# Patient Record
Sex: Female | Born: 1980 | Race: Black or African American | Hispanic: No | Marital: Single | Smoking: Never smoker
Health system: Southern US, Community
[De-identification: ages and names within clinical notes are randomized; demographics above are authoritative.]

## PROBLEM LIST (undated history)

## (undated) DIAGNOSIS — J189 Pneumonia, unspecified organism: Secondary | ICD-10-CM

## (undated) DIAGNOSIS — Z8041 Family history of malignant neoplasm of ovary: Secondary | ICD-10-CM

## (undated) DIAGNOSIS — A9239 West Nile virus infection with other complications: Secondary | ICD-10-CM

## (undated) DIAGNOSIS — Z800 Family history of malignant neoplasm of digestive organs: Secondary | ICD-10-CM

## (undated) DIAGNOSIS — Z803 Family history of malignant neoplasm of breast: Secondary | ICD-10-CM

## (undated) DIAGNOSIS — K219 Gastro-esophageal reflux disease without esophagitis: Secondary | ICD-10-CM

## (undated) DIAGNOSIS — Z1371 Encounter for nonprocreative screening for genetic disease carrier status: Secondary | ICD-10-CM

## (undated) DIAGNOSIS — R569 Unspecified convulsions: Secondary | ICD-10-CM

## (undated) DIAGNOSIS — Z8042 Family history of malignant neoplasm of prostate: Secondary | ICD-10-CM

## (undated) DIAGNOSIS — M1990 Unspecified osteoarthritis, unspecified site: Secondary | ICD-10-CM

## (undated) DIAGNOSIS — D219 Benign neoplasm of connective and other soft tissue, unspecified: Secondary | ICD-10-CM

## (undated) DIAGNOSIS — E669 Obesity, unspecified: Secondary | ICD-10-CM

## (undated) DIAGNOSIS — D649 Anemia, unspecified: Secondary | ICD-10-CM

## (undated) HISTORY — DX: Family history of malignant neoplasm of prostate: Z80.42

## (undated) HISTORY — DX: Family history of malignant neoplasm of breast: Z80.3

## (undated) HISTORY — PX: ABDOMINAL HYSTERECTOMY: SHX81

## (undated) HISTORY — DX: Family history of malignant neoplasm of ovary: Z80.41

## (undated) HISTORY — PX: MULTIPLE TOOTH EXTRACTIONS: SHX2053

## (undated) HISTORY — DX: Obesity, unspecified: E66.9

## (undated) HISTORY — PX: BRAIN SURGERY: SHX531

## (undated) HISTORY — PX: DILATION AND CURETTAGE OF UTERUS: SHX78

## (undated) HISTORY — DX: Family history of malignant neoplasm of digestive organs: Z80.0

---

## 2009-07-02 DIAGNOSIS — A9239 West Nile virus infection with other complications: Secondary | ICD-10-CM

## 2009-07-02 DIAGNOSIS — G038 Meningitis due to other specified causes: Secondary | ICD-10-CM

## 2009-07-02 HISTORY — PX: ESOPHAGEAL DILATION: SHX303

## 2009-07-02 HISTORY — DX: Meningitis due to other specified causes: G03.8

## 2009-07-02 HISTORY — PX: BRAIN SURGERY: SHX531

## 2009-07-02 HISTORY — DX: West Nile virus infection with other complications: A92.39

## 2009-12-12 ENCOUNTER — Encounter: Admission: RE | Admit: 2009-12-12 | Discharge: 2010-02-15 | Payer: Self-pay | Admitting: Rheumatology

## 2010-02-02 ENCOUNTER — Ambulatory Visit: Payer: Self-pay | Admitting: Psychology

## 2011-10-31 ENCOUNTER — Ambulatory Visit (HOSPITAL_COMMUNITY)
Admission: RE | Admit: 2011-10-31 | Discharge: 2011-10-31 | Disposition: A | Discharge status: Home / Self Care | Payer: Medicaid Other | Source: Ambulatory Visit | Attending: Pediatrics | Admitting: Pediatrics

## 2011-10-31 ENCOUNTER — Other Ambulatory Visit (HOSPITAL_COMMUNITY): Payer: Self-pay

## 2011-10-31 DIAGNOSIS — A9230 West Nile virus infection, unspecified: Secondary | ICD-10-CM | POA: Insufficient documentation

## 2011-10-31 NOTE — Procedures (Signed)
EEG NUMBER:  [id number].  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  She has not had seizures since hospitalization.  The purpose of this EEG is to determine if she can wean from anticonvulsants.  MEDICATIONS:  Keppra.  The EEG was done with the patient awake and drowsy.  During periods of maximal wakefulness, she had a well regulated, well-sustained 10 cycle per 2nd, low dominant amplitude, posterior dominant rhythm that attenuated with eye opening and it was symmetric.  Background activities were composed of low to very low amplitude beta activities that were symmetric.  Photic stimulation produced a symmetric driving response. Hyperventilation for 3 minutes with good effort did not markedly change the tracing.  The patient became drowsy as characterized by the onset of slow roving eye movements, the attenuation of muscle activity, and the attenuation of the alpha rhythm.  Slower background activities in the theta and delta range were seen that were symmetric.  Stage 2 sleep was not reached.  Clinical interpretation: This sleep-deprived EEG done with the patient awake and drowsy he is normal.          ______________________________ Denton Meek, MD    GN:FAOZ D:  10/31/2011 20:56:37  T:  10/31/2011 21:10:21  Job #:  [id number]

## 2012-01-14 ENCOUNTER — Other Ambulatory Visit: Payer: Self-pay | Admitting: Obstetrics & Gynecology

## 2012-01-14 ENCOUNTER — Other Ambulatory Visit (HOSPITAL_COMMUNITY): Payer: Self-pay | Admitting: *Deleted

## 2012-01-14 DIAGNOSIS — R51 Headache: Secondary | ICD-10-CM

## 2012-01-18 ENCOUNTER — Other Ambulatory Visit (HOSPITAL_COMMUNITY): Payer: Medicaid Other

## 2012-01-30 ENCOUNTER — Encounter (HOSPITAL_COMMUNITY): Payer: Self-pay | Admitting: Pharmacist

## 2012-02-01 ENCOUNTER — Other Ambulatory Visit: Payer: Self-pay | Admitting: Obstetrics and Gynecology

## 2012-02-07 ENCOUNTER — Encounter (HOSPITAL_COMMUNITY): Payer: Self-pay

## 2012-02-07 ENCOUNTER — Other Ambulatory Visit (HOSPITAL_COMMUNITY): Payer: Medicaid Other

## 2012-02-07 ENCOUNTER — Encounter (HOSPITAL_COMMUNITY)
Admission: RE | Admit: 2012-02-07 | Discharge: 2012-02-07 | Disposition: A | Discharge status: Home / Self Care | Payer: Medicaid Other | Source: Ambulatory Visit | Attending: Obstetrics and Gynecology | Admitting: Obstetrics and Gynecology

## 2012-02-07 HISTORY — DX: Anemia, unspecified: D64.9

## 2012-02-07 HISTORY — DX: Gastro-esophageal reflux disease without esophagitis: K21.9

## 2012-02-07 HISTORY — DX: Unspecified convulsions: R56.9

## 2012-02-07 LAB — CBC
Hemoglobin: 11.4 g/dL — ABNORMAL LOW (ref 12.0–15.0)
MCHC: 31.8 g/dL (ref 30.0–36.0)
Platelets: 399 10*3/uL (ref 150–400)

## 2012-02-07 LAB — BASIC METABOLIC PANEL
Chloride: 100 mEq/L (ref 96–112)
GFR calc Af Amer: >90 mL/min (ref >90)
Potassium: 3.6 mEq/L (ref 3.5–5.1)

## 2012-02-07 NOTE — Patient Instructions (Addendum)
20 MELLA INCLAN  02/07/2012   Your procedure is scheduled on:  02/12/12  Enter through the Main Entrance of [facility] at 1030 AM.  Pick up the phone at the desk and dial [phone].   Call this number if you have problems the morning of surgery: [phone]   Remember:   Do not eat food:After Midnight.  Do not drink clear liquids: After Midnight.  Take these medicines the morning of surgery with A SIP OF WATER: Keppra   Do not wear jewelry, make-up or nail polish.  Do not wear lotions, powders, or perfumes. You may wear deodorant.  Do not shave 48 hours prior to surgery.  Do not bring valuables to the hospital.  Contacts, dentures or bridgework may not be worn into surgery.  Leave suitcase in the car. After surgery it may be brought to your room.  For patients admitted to the hospital, checkout time is 11:00 AM the day of discharge.   Patients discharged the day of surgery will not be allowed to drive home.  Name and phone number of your driver: mother   Maryclare Bean  Special Instructions: CHG Shower Use Special Wash: 1/2 bottle night before surgery and 1/2 bottle morning of surgery.   Please read over the following fact sheets that you were given: Surgical Site Infection Prevention

## 2012-02-07 NOTE — Pre-Procedure Instructions (Signed)
Patient history reviewed with Dr [name], no need for anes. Pre-op.

## 2012-02-12 ENCOUNTER — Encounter (HOSPITAL_COMMUNITY)
Admission: RE | Disposition: A | Discharge status: Home / Self Care | Payer: Self-pay | Source: Ambulatory Visit | Attending: Obstetrics and Gynecology

## 2012-02-12 ENCOUNTER — Encounter (HOSPITAL_COMMUNITY): Payer: Self-pay | Admitting: Anesthesiology

## 2012-02-12 ENCOUNTER — Encounter (HOSPITAL_COMMUNITY): Payer: Self-pay | Admitting: *Deleted

## 2012-02-12 ENCOUNTER — Ambulatory Visit (HOSPITAL_COMMUNITY)
Admission: RE | Admit: 2012-02-12 | Discharge: 2012-02-12 | Disposition: A | Discharge status: Home / Self Care | Payer: Medicaid Other | Source: Ambulatory Visit | Attending: Obstetrics and Gynecology | Admitting: Obstetrics and Gynecology

## 2012-02-12 ENCOUNTER — Ambulatory Visit (HOSPITAL_COMMUNITY): Payer: Medicaid Other | Admitting: Anesthesiology

## 2012-02-12 DIAGNOSIS — D649 Anemia, unspecified: Secondary | ICD-10-CM | POA: Insufficient documentation

## 2012-02-12 DIAGNOSIS — D250 Submucous leiomyoma of uterus: Secondary | ICD-10-CM | POA: Insufficient documentation

## 2012-02-12 DIAGNOSIS — N920 Excessive and frequent menstruation with regular cycle: Secondary | ICD-10-CM | POA: Insufficient documentation

## 2012-02-12 LAB — PREGNANCY, URINE: Preg Test, Ur: NEGATIVE

## 2012-02-12 SURGERY — DILATATION & CURETTAGE/HYSTEROSCOPY WITH RESECTOCOPE
Anesthesia: General | Site: Vagina | Wound class: Clean Contaminated

## 2012-02-12 MED ORDER — METOCLOPRAMIDE HCL 5 MG/ML IJ SOLN: 10.0000 mg | Freq: Once | INTRAMUSCULAR | Status: DC | PRN

## 2012-02-12 MED ORDER — ONDANSETRON HCL 4 MG/2ML IJ SOLN
INTRAMUSCULAR | Status: DC | PRN
  Administered 2012-02-12: 4 mg via INTRAVENOUS

## 2012-02-12 MED ORDER — ONDANSETRON HCL 4 MG/2ML IJ SOLN
INTRAMUSCULAR | Status: AC
  Filled 2012-02-12: qty 2

## 2012-02-12 MED ORDER — CEFAZOLIN SODIUM-DEXTROSE 2-3 GM-% IV SOLR: 2.0000 g | INTRAVENOUS | Status: DC

## 2012-02-12 MED ORDER — FENTANYL CITRATE 0.05 MG/ML IJ SOLN
INTRAMUSCULAR | Status: AC
  Administered 2012-02-12: 50 ug via INTRAVENOUS
  Filled 2012-02-12: qty 2

## 2012-02-12 MED ORDER — MEPERIDINE HCL 25 MG/ML IJ SOLN: 6.2500 mg | INTRAMUSCULAR | Status: DC | PRN

## 2012-02-12 MED ORDER — METHYLERGONOVINE MALEATE 0.2 MG PO TABS: 0.2000 mg | ORAL_TABLET | Freq: Four times a day (QID) | ORAL | Status: DC

## 2012-02-12 MED ORDER — LIDOCAINE HCL (CARDIAC) 20 MG/ML IV SOLN
INTRAVENOUS | Status: AC
  Filled 2012-02-12: qty 5

## 2012-02-12 MED ORDER — FENTANYL CITRATE 0.05 MG/ML IJ SOLN
INTRAMUSCULAR | Status: AC
  Filled 2012-02-12: qty 2

## 2012-02-12 MED ORDER — MIDAZOLAM HCL 5 MG/5ML IJ SOLN
INTRAMUSCULAR | Status: DC | PRN
  Administered 2012-02-12 (×2): 1 mg via INTRAVENOUS

## 2012-02-12 MED ORDER — FENTANYL CITRATE 0.05 MG/ML IJ SOLN
INTRAMUSCULAR | Status: DC | PRN
  Administered 2012-02-12 (×2): 50 ug via INTRAVENOUS

## 2012-02-12 MED ORDER — LACTATED RINGERS IV SOLN
INTRAVENOUS | Status: DC
  Administered 2012-02-12 (×2): via INTRAVENOUS

## 2012-02-12 MED ORDER — LIDOCAINE HCL (CARDIAC) 20 MG/ML IV SOLN
INTRAVENOUS | Status: DC | PRN
  Administered 2012-02-12: 60 mg via INTRAVENOUS

## 2012-02-12 MED ORDER — FENTANYL CITRATE 0.05 MG/ML IJ SOLN
25.0000 ug | INTRAMUSCULAR | Status: DC | PRN
  Administered 2012-02-12: 50 ug via INTRAVENOUS

## 2012-02-12 MED ORDER — PROPOFOL 10 MG/ML IV EMUL
INTRAVENOUS | Status: AC
  Filled 2012-02-12: qty 20

## 2012-02-12 MED ORDER — CLINDAMYCIN PHOSPHATE 900 MG/50ML IV SOLN
900.0000 mg | Freq: Once | INTRAVENOUS | Status: AC
  Administered 2012-02-12: 900 mg via INTRAVENOUS
  Filled 2012-02-12: qty 50

## 2012-02-12 MED ORDER — METHYLERGONOVINE MALEATE 0.2 MG/ML IJ SOLN
INTRAMUSCULAR | Status: DC | PRN
  Administered 2012-02-12: 0.2 mg via INTRAMUSCULAR

## 2012-02-12 MED ORDER — MIDAZOLAM HCL 2 MG/2ML IJ SOLN
INTRAMUSCULAR | Status: AC
  Filled 2012-02-12: qty 2

## 2012-02-12 MED ORDER — GLYCINE 1.5 % IR SOLN
Status: DC | PRN
  Administered 2012-02-12 (×2): 3000 mL

## 2012-02-12 MED ORDER — DEXAMETHASONE SODIUM PHOSPHATE 4 MG/ML IJ SOLN
INTRAMUSCULAR | Status: DC | PRN
  Administered 2012-02-12: 10 mg via INTRAVENOUS

## 2012-02-12 MED ORDER — DEXAMETHASONE SODIUM PHOSPHATE 10 MG/ML IJ SOLN
INTRAMUSCULAR | Status: AC
  Filled 2012-02-12: qty 1

## 2012-02-12 MED ORDER — PROPOFOL 10 MG/ML IV EMUL
INTRAVENOUS | Status: DC | PRN
  Administered 2012-02-12: 200 mg via INTRAVENOUS

## 2012-02-12 SURGICAL SUPPLY — 14 items
CANISTER SUCTION 2500CC (MISCELLANEOUS) ×2 IMPLANT
CATH ROBINSON RED A/P 16FR (CATHETERS) ×2 IMPLANT
CLOTH BEACON ORANGE TIMEOUT ST (SAFETY) ×2 IMPLANT
CONTAINER PREFILL 10% NBF 60ML (FORM) ×4 IMPLANT
ELECT REM PT RETURN 9FT ADLT (ELECTROSURGICAL) ×2
ELECTRODE REM PT RTRN 9FT ADLT (ELECTROSURGICAL) ×1 IMPLANT
GLOVE ECLIPSE 7.0 STRL STRAW (GLOVE) ×4 IMPLANT
GOWN PREVENTION PLUS LG XLONG (DISPOSABLE) ×2 IMPLANT
GOWN PREVENTION PLUS XLARGE (GOWN DISPOSABLE) ×2 IMPLANT
GOWN STRL REIN XL XLG (GOWN DISPOSABLE) ×2 IMPLANT
LOOP ANGLED CUTTING 22FR (CUTTING LOOP) IMPLANT
PACK HYSTEROSCOPY LF (CUSTOM PROCEDURE TRAY) ×2 IMPLANT
TOWEL OR 17X24 6PK STRL BLUE (TOWEL DISPOSABLE) ×4 IMPLANT
WATER STERILE IRR 1000ML POUR (IV SOLUTION) ×2 IMPLANT

## 2012-02-12 NOTE — OR Nursing (Signed)
Dr, Dareen Piano packed a 4X18 sponge in the vagina postop.

## 2012-02-12 NOTE — Progress Notes (Signed)
Patient has scant amount of blood noted on her pad.  Patient to be discharged home shortly.

## 2012-02-12 NOTE — Progress Notes (Signed)
MD requested that vaginal packing be removed at 1400.  Vaginal packing was removed.  Packing was soaked with blood.  Clean peripad was applied.  RN to cont. To monitor vaginal bleeding.

## 2012-02-12 NOTE — Anesthesia Procedure Notes (Signed)
Procedure Name: LMA Insertion Date/Time: 02/12/2012 12:32 PM Performed by: Graciela Husbands Pre-anesthesia Checklist: Emergency Drugs available, Suction available, Patient being monitored, Patient identified and Timeout performed Patient Re-evaluated:Patient Re-evaluated prior to inductionOxygen Delivery Method: Circle system utilized Preoxygenation: Pre-oxygenation with 100% oxygen Intubation Type: IV induction Ventilation: Mask ventilation without difficulty LMA: LMA inserted LMA Size: 4.0 Number of attempts: 1 Placement Confirmation: breath sounds checked- equal and bilateral and positive ETCO2 Tube secured with: Tape Dental Injury: Teeth and Oropharynx as per pre-operative assessment

## 2012-02-12 NOTE — Transfer of Care (Signed)
Immediate Anesthesia Transfer of Care Note  Patient: Erin Ray  Procedure(s) Performed: Procedure(s) (LRB): DILATATION & CURETTAGE/HYSTEROSCOPY WITH RESECTOCOPE (N/A)  Patient Location: PACU  Anesthesia Type: General  Level of Consciousness: awake, alert  and oriented  Airway & Oxygen Therapy: Patient Spontanous Breathing and Patient connected to nasal cannula oxygen  Post-op Assessment: Report given to PACU RN and Post -op Vital signs reviewed and stable  Post vital signs: Reviewed and stable  Complications: No apparent anesthesia complications

## 2012-02-12 NOTE — H&P (Signed)
Pt is a 31 year old black female who presents to the OR for a D&C, Hysteroscopy, possible resection of fibroids secondary to menorrhagia and anemia. PE: WDWN Black female in NAD.         ABD- gravid, palp fibroid        Pelvic- ext -wnl                    Vagina- wnl                    cx- nullip                    Ut- 12-13 weeks IMP/ menorrhagia         Fibroids         Anemia PLAN/ Hysteroscopy            D&C

## 2012-02-12 NOTE — Anesthesia Preprocedure Evaluation (Addendum)
Anesthesia Evaluation  Patient identified by MRN, date of birth, ID band Patient awake    Reviewed: Allergy & Precautions, H&P , NPO status , Patient's Chart, lab work & pertinent test results  Airway Mallampati: III TM Distance: >3 FB Neck ROM: Full    Dental No notable dental hx. 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 Has been on Keppra.  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   Endo/Other  negative endocrine ROS  Renal/GU negative Renal ROS  negative genitourinary   Musculoskeletal negative musculoskeletal ROS (+)   Abdominal Normal abdominal exam  (+)   Peds  Hematology  (+) Blood dyscrasia, anemia ,   Anesthesia Other Findings   Reproductive/Obstetrics negative OB ROS Fibroids Menorrhagia                          Anesthesia Physical Anesthesia Plan  ASA: II  Anesthesia Plan: General   Post-op Pain Management:    Induction: Intravenous  Airway Management Planned: LMA  Additional Equipment:   Intra-op Plan:   Post-operative Plan: Extubation in OR  Informed Consent: I have reviewed the patients History and Physical, chart, labs and discussed the procedure including the risks, benefits and alternatives for the proposed anesthesia with the patient or authorized representative who has indicated his/her understanding and acceptance.   Dental advisory given  Plan Discussed with: CRNA, Anesthesiologist and Surgeon  Anesthesia Plan Comments:         Anesthesia Quick Evaluation

## 2012-02-13 NOTE — Anesthesia Postprocedure Evaluation (Signed)
  Anesthesia Post-op Note  Patient: Erin Ray  Procedure(s) Performed: Procedure(s) (LRB): DILATATION & CURETTAGE/HYSTEROSCOPY WITH RESECTOCOPE (N/A)  Patient is awake and responsive. Pain and nausea are reasonably well controlled. Vital signs are stable and clinically acceptable. Oxygen saturation is clinically acceptable. There are no apparent anesthetic complications at this time. Patient is ready for discharge.

## 2012-02-14 NOTE — Op Note (Signed)
Erin Ray, SAMPSEL NO.:  [id number]  MEDICAL RECORD NO.:  [id number]  LOCATION:  [facility]                          FACILITY:  [facility]  PHYSICIAN:  Malva Limes, M.D.    DATE OF BIRTH:  [birth year]  DATE OF PROCEDURE:  02/13/2012 DATE OF DISCHARGE:  02/12/2012                              OPERATIVE REPORT   PREOPERATIVE DIAGNOSES: 1. Menorrhagia. 2. Multiple uterine fibroids.  POSTOPERATIVE DIAGNOSES: 1. Menorrhagia. 2. Multiple uterine fibroids.  PROCEDURE: 1. Hysteroscopy. 2. Resection of submucous fibroids. 3. Dilation curettage.  SURGEON:  Malva Limes, M.D.  ANESTHESIA:  General.  ANTIBIOTICS:  Clindamycin 900 mg.  DRAINS:  Red rubber catheter bladder.  SPECIMENS:  Endometrial curettings sent to pathology.  PROCEDURE:  The patient was taken to the operating room, where a general anesthetic was administered without difficulty.  She was then placed in dorsal lithotomy position.  She was prepped and draped in the usual fashion for this procedure.  An exam under anesthesia revealed anteverted uterus with multiple leiomyomata.  Uterus was approximately 14 weeks size.  At this point, a sterile speculum was placed in the vagina.  A single-tooth tenaculum was applied to the anterior cervical lip.  The cervix was then serially dilated to a 27-French.  The hysteroscope was advanced through the cervical canal in the lower uterine segment.  The patient appeared to have a 1 cm submucous fibroid in the anterior lower uterine segment and a 3 cm submucous fibroid in the posterior aspect of the lower uterine segment.  The upper cavity appeared to be normal.  Both ostia appeared to be normal.  At this point, the cervix was dilated to a 31-French and the hysteroscope was advanced through the endocervical canal.  The anterior submucous polyp was easily removed.  The anterior fibroid was easily removed.  The posterior fibroid was significantly larger and did not  protrude into the cavity as much as the anterior fibroid did.  The fibroid was shaved down flat to be consistent with the remaining wall.  Once this was accomplished, D and C was performed.  Endometrial curettings were sent to pathology.  The patient was awoken and taken to recovery room in stable condition.  Instrument and lap counts correct x2.  The patient was discharged to home.  She was sent home with Methergine 0.2 mg p.o. q.6 hours x6 because there was some bleeding noted from the posterior fibroid.  She was also sent home with Vicodin to take p.r.n.  She was told to follow up in the office in 2-3 weeks.          ______________________________ Malva Limes, M.D.     MA/MEDQ  D:  02/13/2012  T:  02/14/2012  Job:  [id number]

## 2012-06-30 ENCOUNTER — Encounter: Payer: Self-pay | Admitting: Obstetrics & Gynecology

## 2012-06-30 ENCOUNTER — Ambulatory Visit (INDEPENDENT_AMBULATORY_CARE_PROVIDER_SITE_OTHER): Payer: Medicaid Other | Admitting: Obstetrics & Gynecology

## 2012-06-30 VITALS — BP 115/77 | HR 72 | Temp 97.1°F | Ht 62.0 in | Wt 165.2 lb

## 2012-06-30 DIAGNOSIS — D259 Leiomyoma of uterus, unspecified: Secondary | ICD-10-CM

## 2012-06-30 DIAGNOSIS — D219 Benign neoplasm of connective and other soft tissue, unspecified: Secondary | ICD-10-CM

## 2012-06-30 DIAGNOSIS — N920 Excessive and frequent menstruation with regular cycle: Secondary | ICD-10-CM

## 2012-06-30 NOTE — Progress Notes (Signed)
  Subjective:    Patient ID: Erin Ray, female    DOB: [birth year], 31 y.o.   MRN: [id number]  HPI  31 yo S AA P0 who was referred here from Dr. Ewell Poe office for a possible RATH. She had a hysteroscopic resection of a submucosal fibroid by Dr. Dareen Piano 8/13 because of her menorrhagia (8-10 day periods and resulting anemia). She also complains of pelvic/back pressure. She tells me that the [facility] and will be getting more blood work there next month. She says that they checked her TSH and it was normal. She says that she had a MRI due to back pain at [facility]. No imaging records are available through [facility].  Review of Systems She had a case of West Nile Virus about 3 years ago and required drainage of brain fluid. She is not currently dating anyone and doesn't think that she wants children.    Objective:   Physical Exam        Assessment & Plan:  Symptomatic fibroids. We had a very long discussion about treatment options. She is not ready to commit to a hysterectomy yet, so I suggested a Mirena IUD and/or [place]. I have given her written info on Mirena and will have a nurse schedule her for an appt with IR about a possible [place].

## 2012-06-30 NOTE — Patient Instructions (Signed)
Fibroids Fibroids are lumps (tumors) that can occur any place in a woman's body. These lumps are not cancerous. Fibroids vary in size, weight, and where they grow. HOME CARE  Do not take aspirin.  Write down the number of pads or tampons you use during your period. Tell your doctor. This can help determine the best treatment for you. GET HELP RIGHT AWAY IF:  You have pain in your lower belly (abdomen) that is not helped with medicine.  You have cramps that are not helped with medicine.  You have more bleeding between or during your period.  You feel lightheaded or pass out (faint).  Your lower belly pain gets worse. MAKE SURE YOU:  Understand these instructions.  Will watch your condition.  Will get help right away if you are not doing well or get worse. Document Released: 07/21/2010 Document Revised: 09/10/2011 Document Reviewed: 07/21/2010 Exit[employer] Patient Information 2013 [employer].  

## 2012-07-01 LAB — GC/CHLAMYDIA PROBE AMP, URINE: GC Probe Amp, Urine: NEGATIVE

## 2012-07-10 ENCOUNTER — Telehealth: Payer: Self-pay | Admitting: *Deleted

## 2012-07-10 DIAGNOSIS — N920 Excessive and frequent menstruation with regular cycle: Secondary | ICD-10-CM

## 2012-07-10 NOTE — Telephone Encounter (Signed)
Message copied by Jill Side on Thu Jul 10, 2012  2:44 PM ------      Message from: Nicholaus Bloom C      Created: Mon Jun 30, 2012  3:17 PM       Please see the order for IR referral for possible uterine artery embolization and contact patient with appt.      Thanks

## 2012-07-10 NOTE — Telephone Encounter (Signed)
Pt left message with questions about her referral appt to Interventional Radiology- consult for Uterine Artery Embolization. She further states that Dr. Marice Potter wanted her to have a copy of the MRI results from [facility] to take to the referral appt which she now has. Please call back with referral information.

## 2012-07-16 NOTE — Telephone Encounter (Signed)
[facility] @ [phone] and made an appt for the pt for July 23, 2012 @ 145 pm for a 2 pm appt with Dr. Archer Asa.  Called pt and informed pt of appt scheduled at the [facility] and to make sure that she takes her MRI results to that referral appt.  Pt stated understanding and did not have any other questions.

## 2012-07-23 ENCOUNTER — Ambulatory Visit
Admission: RE | Admit: 2012-07-23 | Discharge: 2012-07-23 | Disposition: A | Discharge status: Home / Self Care | Payer: Medicare Other | Source: Ambulatory Visit | Attending: Obstetrics & Gynecology | Admitting: Obstetrics & Gynecology

## 2012-07-23 DIAGNOSIS — N920 Excessive and frequent menstruation with regular cycle: Secondary | ICD-10-CM

## 2012-07-23 HISTORY — DX: Benign neoplasm of connective and other soft tissue, unspecified: D21.9

## 2012-07-24 ENCOUNTER — Other Ambulatory Visit: Payer: Self-pay | Admitting: Interventional Radiology

## 2012-07-24 DIAGNOSIS — D219 Benign neoplasm of connective and other soft tissue, unspecified: Secondary | ICD-10-CM

## 2012-08-02 ENCOUNTER — Ambulatory Visit
Admission: RE | Admit: 2012-08-02 | Discharge: 2012-08-02 | Disposition: A | Discharge status: Home / Self Care | Payer: Medicare Other | Source: Ambulatory Visit | Attending: Interventional Radiology | Admitting: Interventional Radiology

## 2012-08-02 DIAGNOSIS — D219 Benign neoplasm of connective and other soft tissue, unspecified: Secondary | ICD-10-CM

## 2012-08-02 MED ORDER — GADOBENATE DIMEGLUMINE 529 MG/ML IV SOLN
15.0000 mL | Freq: Once | INTRAVENOUS | Status: AC | PRN
  Administered 2012-08-02: 15 mL via INTRAVENOUS

## 2012-08-04 NOTE — Consult Note (Signed)
Interventional Radiology New Patient Consultation  Date: 07/28/2012 CC: Symptomatic uterine fibroids Referring Physician: Myra C. Marice Potter, MD  HPI:  Erin Ray is a 32 year old G0 P0 African-American female with a several year history of menorrhagia, dysmenorrhea and bulk-related symptoms. She has undergone prior intervention for her symptomatic fibroids. She had a partial transcervical myomectomy and D&C by Dr. Malva Limes in August of 2013. She reports that this did help alleviate her symptoms for a brief period, but they have recurred. Her menstrual cycles are regular, occurring approximately every 28 days. They last seven days and the first 1-5 days are very heavy. She wears super absorbent pads  and has to change them every 30 minutes. It is difficult to keep up with this and she often is confined to the house during her heaviest bleeding days. She does not have any issues with interperiod bleeding or spotting. Her dysmenorrheic symptoms are fairly mild overall. She reports some cramping but not excessive. She does, however, endorse bulk symptoms of bloating, lower abdominal and pelvic pressure, and occasional urinary frequency/urgency. She also feels that her abdomen protrudes because of the enlarged fibroid uterus. She has discussed additional treatment options, including a Mirena IUD and robotic-assisted hysterectomy. She is not interested in the IUD as she  feels it will do nothing to alleviate her bulk symptoms. She is contemplating robotic-assisted hysterectomy although she is not completely certain she is willing to have her uterus removed and lose all options for potential motherhood.  She denies dyspareunia although she is currently not sexually active. Her symptoms have been going on for some time but seem particularly increased over the last year.   Past Medical History:  1. 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  2.  Wisdom teeth extracted in 2013.  3. Esophageal angioplasty in 2011.   Past Surgical History:  Endocervical partial myomectomy and D&C in August 2013.   Medications:  Loratadine once daily, Keppra one tablet bid, Motrin over-the-counter prn.   Allergies: Penicillin, amoxicillin.   Social History: Single, not currently romantically involved. No prior children. I discussed at length the issue of future  pregnancy with Erin Ray. Right now, she cannot contemplate the thought of having children; however, she does admit that it is difficult to know how she will feel over several years from now. She currently lives in [place] and is on disability.   Family History: Uterine fibroids run in her family. Her mother had a hysterectomy at age 58 for symptomatic uterine fibroids.   Review of Systems: Patient endorses symptoms of dry eyes. Otherwise pertinent positives listed above and the 14-point review of systems was negative.   Physical Exam:  Vital signs: Blood pressure 112/68, heart rate 70, respiratory rate 16, temperature 98.2, oxygen saturation 99% on room air.  General: Mildly overweight African-American female in no acute distress. She is in good spirits today.  Cardiac: Regular rate and rhythm. No murmurs, rubs or gallops.  Pulmonary: Clear to auscultation bilaterally.  Abdomen: A firm fibroid uterus is palpated just below the umbilicus. The abdomen is otherwise soft and nontender.  Pulses: 2+ femoral and dorsalis pedis pulses.  Neuro: Alert and oriented x3. Normal affect.   Assessment & Plan:   Erin Ray is a 32 year old G0 P0 female with symptomatic uterine fibroids. Her dominant symptoms are  menorrhagia and bulk-related symptoms. I discussed with her at length the options of treatment for uterine fibroids, including hormonal therapy with  OCPs or a Mirena IUD. She is currently not interested in these therapies as they will not do much to treat her bulk symptoms.   Currently, she  is debating between robotic-assisted hysterectomy and uterine artery embolization. I informed her that  our current best data demonstrates an 80% continued symptom-free rate at five years. Given Erin Ray relatively young age, it is difficult to predict whether or not she will suffer from recurrent fibroid symptoms at some point in the future. She likely has 20-23 years before menopause and so I feel it may be likely that she has recurrent symptoms at some point. However, successful pregnancy and delivery are possible following uterine artery embolization  although at a slightly less optimal rate than myomectomy. If the patient undergoes hysterectomy, she certainly will not have the option to have children in the future.   We discussed all of these issues in addition to the differences in recovery time. Currently, Erin Ray is leaning toward uterine artery embolization. We will schedule her for a pelvic MRI to evaluate the size, number and location of her fibroids. Barring any unexpected findings, we will then schedule her for uterine artery embolization. If she does have recurrent symptoms at some point in the future, she can undergo repeat uterine artery embolization, hysterectomy, or potentially a new and better therapy that is still on the horizon.   Thank you very much for this consultation. It was a pleasure to meet Erin Ray and I look forward to participating in her care.   Signed,   Sterling Big, MD  Vascular and Interventional Radiologist  [facility]

## 2012-08-05 ENCOUNTER — Telehealth: Payer: Self-pay | Admitting: Interventional Radiology

## 2012-08-07 ENCOUNTER — Encounter: Payer: Self-pay | Admitting: *Deleted

## 2012-08-08 ENCOUNTER — Telehealth: Payer: Self-pay | Admitting: Interventional Radiology

## 2012-08-08 NOTE — Telephone Encounter (Signed)
I called Erin Ray at 5:00pm on 08/07/12 to discuss the results of her pelvic MRI.  I explained to her that her largest fibroid is already devascularized, and her second largest and exophytic fibroid is partially devascularized.  These two fibroids which likely make a significant contribution to her overall bulk symptoms will not be responsive to [place].  She does have quite a few additional enhancing fibroids which contribute to her menorrhagia which would be a great target for [place].  Since her primary concern is her bulk symptoms more so than her heavy bleeding, I explained that she is a less than optimal candidate for [place].    I have also spoken with her OB-Gyn, Dr. Marice Potter, about the results of her MRI.  A myomectomy may be her best option as this would be more likely to reduce her bulk symptoms and still offer her the possibility of future conception.  If her bulk symptoms are improved following myomectomy and she continues to have significant menorrhagia, we can always still proceed with a [place] at that time to treat those symptoms.  I asked her to contact Dr. Ellin Saba office to discuss potential myomectomy.    Signed,  Sterling Big, MD Vascular & Interventional Radiologist [facility]

## 2012-09-03 ENCOUNTER — Ambulatory Visit (INDEPENDENT_AMBULATORY_CARE_PROVIDER_SITE_OTHER): Payer: Medicare Other | Admitting: Obstetrics & Gynecology

## 2012-09-03 VITALS — BP 120/80 | HR 64 | Temp 96.3°F | Ht 62.0 in | Wt 170.2 lb

## 2012-09-03 DIAGNOSIS — N949 Unspecified condition associated with female genital organs and menstrual cycle: Secondary | ICD-10-CM

## 2012-09-03 NOTE — Progress Notes (Signed)
  Subjective:    Patient ID: Erin Ray, female    DOB: 24-Jan-1981, 32 y.o.   MRN: [id number]  HPI  32 yo S AA G0 is here today because after several months of consideration she has decided that she would prefer a RATH to treat her multiple fibroids, menorrhagia, pelvic pain, and anemia. She saw IR MD last month and he did not feel that [place] would be a good treatment for her because of her bulk symptoms. She tells me that she has raised "too many" of other people's children and has decided for sure that she does not want to give birth.  Review of Systems     Objective:   Physical Exam        Assessment & Plan:  Symptomatic fibroids- I will schedule her for a RATH/bilateral salpingectomy, cystoscopy.

## 2012-09-05 ENCOUNTER — Encounter: Payer: Self-pay | Admitting: Obstetrics & Gynecology

## 2012-09-25 ENCOUNTER — Encounter (HOSPITAL_COMMUNITY): Payer: Self-pay | Admitting: Pharmacist

## 2012-09-29 ENCOUNTER — Encounter: Payer: Self-pay | Admitting: Obstetrics & Gynecology

## 2012-09-29 ENCOUNTER — Encounter (HOSPITAL_COMMUNITY)
Admission: RE | Admit: 2012-09-29 | Discharge: 2012-09-29 | Disposition: A | Discharge status: Home / Self Care | Payer: Medicare Other | Source: Ambulatory Visit | Attending: Obstetrics & Gynecology | Admitting: Obstetrics & Gynecology

## 2012-09-29 ENCOUNTER — Ambulatory Visit (INDEPENDENT_AMBULATORY_CARE_PROVIDER_SITE_OTHER): Payer: Medicare Other | Admitting: Obstetrics & Gynecology

## 2012-09-29 ENCOUNTER — Encounter (HOSPITAL_COMMUNITY): Payer: Self-pay

## 2012-09-29 VITALS — BP 118/82 | HR 74 | Temp 96.9°F | Wt 171.0 lb

## 2012-09-29 DIAGNOSIS — D259 Leiomyoma of uterus, unspecified: Secondary | ICD-10-CM

## 2012-09-29 DIAGNOSIS — D219 Benign neoplasm of connective and other soft tissue, unspecified: Secondary | ICD-10-CM

## 2012-09-29 HISTORY — DX: West Nile virus infection with other complications: A92.39

## 2012-09-29 LAB — CBC
HCT: 38 % (ref 36.0–46.0)
Hemoglobin: 12.7 g/dL (ref 12.0–15.0)
MCV: 83 fL (ref 78.0–100.0)
RBC: 4.58 MIL/uL (ref 3.87–5.11)
RDW: 13 % (ref 11.5–15.5)
WBC: 7.4 10*3/uL (ref 4.0–10.5)

## 2012-09-29 LAB — SURGICAL PCR SCREEN: Staphylococcus aureus: INVALID — AB

## 2012-09-29 NOTE — Patient Instructions (Addendum)
Your procedure is scheduled on:10/09/12  Enter through the Main Entrance at :1130 am Pick up desk phone and dial [phone] and inform us of your arrival.  Please call [phone] if you have any problems the morning of surgery.  Remember: Do not eat after midnight: WED Clear liquids ok until 9am Thursday   Take these meds the morning of surgery with a sip of water: Keppra,allergy med  DO NOT wear jewelry, eye make-up, lipstick,body lotion, or dark fingernail polish. Do not shave for 48 hours prior to surgery.  If you are to be admitted after surgery, leave suitcase in car until your room has been assigned. Patients discharged on the day of surgery will not be allowed to drive home.

## 2012-09-29 NOTE — Progress Notes (Signed)
  Subjective:    Patient ID: Erin Ray, female    DOB: 24-Jul-1980, 32 y.o.   MRN: [id number]  HPI  32 yo S AA with known fibroids who is here today for her pre op visit. She is scheduled to have a RATH, bilateral salpingectomy, cystoscopy.    Review of Systems Abstinent for 3 years On disability She has a BM QOD. Pap smear 12/2011 at Dr. Loraine L[facility]. All normal at that office.    Objective:   Physical Exam        Assessment & Plan:  Symptomatic fibroids for surgery as above. We have discussed risks of surgery. I have explained that it will be a easier surgery if she has done a bowel prep prior to surgery.

## 2012-10-02 LAB — MRSA CULTURE

## 2012-10-09 ENCOUNTER — Ambulatory Visit (HOSPITAL_COMMUNITY): Payer: Medicare Other | Admitting: Anesthesiology

## 2012-10-09 ENCOUNTER — Encounter (HOSPITAL_COMMUNITY): Payer: Self-pay | Admitting: Anesthesiology

## 2012-10-09 ENCOUNTER — Encounter (HOSPITAL_COMMUNITY)
Admission: RE | Disposition: A | Discharge status: Home / Self Care | Payer: Self-pay | Source: Ambulatory Visit | Attending: Obstetrics & Gynecology

## 2012-10-09 ENCOUNTER — Observation Stay (HOSPITAL_COMMUNITY)
Admission: RE | Admit: 2012-10-09 | Discharge: 2012-10-10 | Disposition: A | Discharge status: Home / Self Care | Payer: Medicare Other | Source: Ambulatory Visit | Attending: Obstetrics & Gynecology | Admitting: Obstetrics & Gynecology

## 2012-10-09 DIAGNOSIS — N838 Other noninflammatory disorders of ovary, fallopian tube and broad ligament: Secondary | ICD-10-CM | POA: Insufficient documentation

## 2012-10-09 DIAGNOSIS — D259 Leiomyoma of uterus, unspecified: Secondary | ICD-10-CM | POA: Insufficient documentation

## 2012-10-09 DIAGNOSIS — N949 Unspecified condition associated with female genital organs and menstrual cycle: Secondary | ICD-10-CM | POA: Insufficient documentation

## 2012-10-09 DIAGNOSIS — N920 Excessive and frequent menstruation with regular cycle: Principal | ICD-10-CM | POA: Insufficient documentation

## 2012-10-09 DIAGNOSIS — D649 Anemia, unspecified: Secondary | ICD-10-CM | POA: Insufficient documentation

## 2012-10-09 DIAGNOSIS — D219 Benign neoplasm of connective and other soft tissue, unspecified: Secondary | ICD-10-CM

## 2012-10-09 HISTORY — PX: CYSTOSCOPY: SHX5120

## 2012-10-09 HISTORY — PX: ROBOTIC ASSISTED TOTAL HYSTERECTOMY: SHX6085

## 2012-10-09 HISTORY — PX: BILATERAL SALPINGECTOMY: SHX5743

## 2012-10-09 LAB — PREGNANCY, URINE: Preg Test, Ur: NEGATIVE

## 2012-10-09 SURGERY — ROBOTIC ASSISTED TOTAL HYSTERECTOMY
Anesthesia: General | Site: Abdomen | Wound class: Clean Contaminated

## 2012-10-09 MED ORDER — HYDROMORPHONE HCL PF 1 MG/ML IJ SOLN
INTRAMUSCULAR | Status: DC | PRN
  Administered 2012-10-09: 1 mg via INTRAVENOUS

## 2012-10-09 MED ORDER — HYDROMORPHONE HCL PF 1 MG/ML IJ SOLN
0.2500 mg | INTRAMUSCULAR | Status: DC | PRN
  Administered 2012-10-09: 0.5 mg via INTRAVENOUS

## 2012-10-09 MED ORDER — LIDOCAINE HCL (CARDIAC) 20 MG/ML IV SOLN
INTRAVENOUS | Status: DC | PRN
  Administered 2012-10-09: 50 mg via INTRAVENOUS

## 2012-10-09 MED ORDER — DEXAMETHASONE SODIUM PHOSPHATE 10 MG/ML IJ SOLN
INTRAMUSCULAR | Status: AC
  Filled 2012-10-09: qty 1

## 2012-10-09 MED ORDER — LIDOCAINE HCL (CARDIAC) 20 MG/ML IV SOLN
INTRAVENOUS | Status: AC
  Filled 2012-10-09: qty 5

## 2012-10-09 MED ORDER — DEXAMETHASONE SODIUM PHOSPHATE 4 MG/ML IJ SOLN
INTRAMUSCULAR | Status: DC | PRN
  Administered 2012-10-09: 10 mg via INTRAVENOUS

## 2012-10-09 MED ORDER — IBUPROFEN 800 MG PO TABS
800.0000 mg | ORAL_TABLET | Freq: Three times a day (TID) | ORAL | Status: DC | PRN
  Administered 2012-10-09: 800 mg via ORAL
  Filled 2012-10-09: qty 1

## 2012-10-09 MED ORDER — ROCURONIUM BROMIDE 50 MG/5ML IV SOLN
INTRAVENOUS | Status: AC
  Filled 2012-10-09: qty 1

## 2012-10-09 MED ORDER — FENTANYL CITRATE 0.05 MG/ML IJ SOLN
INTRAMUSCULAR | Status: DC | PRN
  Administered 2012-10-09 (×3): 50 ug via INTRAVENOUS
  Administered 2012-10-09: 150 ug via INTRAVENOUS
  Administered 2012-10-09: 50 ug via INTRAVENOUS
  Administered 2012-10-09: 100 ug via INTRAVENOUS
  Administered 2012-10-09: 50 ug via INTRAVENOUS

## 2012-10-09 MED ORDER — LEVETIRACETAM 500 MG PO TABS
500.0000 mg | ORAL_TABLET | Freq: Two times a day (BID) | ORAL | Status: DC
  Administered 2012-10-09 – 2012-10-10 (×2): 500 mg via ORAL
  Filled 2012-10-09 (×2): qty 1

## 2012-10-09 MED ORDER — MIDAZOLAM HCL 2 MG/2ML IJ SOLN
INTRAMUSCULAR | Status: AC
  Filled 2012-10-09: qty 2

## 2012-10-09 MED ORDER — SCOPOLAMINE 1 MG/3DAYS TD PT72
MEDICATED_PATCH | TRANSDERMAL | Status: AC
  Administered 2012-10-09: 1.5 mg via TRANSDERMAL
  Filled 2012-10-09: qty 1

## 2012-10-09 MED ORDER — HYDROMORPHONE HCL PF 1 MG/ML IJ SOLN
0.2000 mg | INTRAMUSCULAR | Status: DC | PRN
  Administered 2012-10-09: 0.5 mg via INTRAVENOUS
  Filled 2012-10-09: qty 1

## 2012-10-09 MED ORDER — LACTATED RINGERS IV SOLN: INTRAVENOUS | Status: DC

## 2012-10-09 MED ORDER — FENTANYL CITRATE 0.05 MG/ML IJ SOLN
INTRAMUSCULAR | Status: AC
  Filled 2012-10-09: qty 5

## 2012-10-09 MED ORDER — CEFAZOLIN SODIUM-DEXTROSE 2-3 GM-% IV SOLR
2.0000 g | INTRAVENOUS | Status: AC
  Administered 2012-10-09: 2 g via INTRAVENOUS

## 2012-10-09 MED ORDER — PROPOFOL 10 MG/ML IV EMUL
INTRAVENOUS | Status: AC
  Filled 2012-10-09: qty 20

## 2012-10-09 MED ORDER — OXYCODONE-ACETAMINOPHEN 5-325 MG PO TABS: 1.0000 | ORAL_TABLET | ORAL | Status: DC | PRN

## 2012-10-09 MED ORDER — ROCURONIUM BROMIDE 100 MG/10ML IV SOLN
INTRAVENOUS | Status: DC | PRN
  Administered 2012-10-09: 20 mg via INTRAVENOUS
  Administered 2012-10-09: 50 mg via INTRAVENOUS
  Administered 2012-10-09: 20 mg via INTRAVENOUS

## 2012-10-09 MED ORDER — LACTATED RINGERS IV SOLN
INTRAVENOUS | Status: DC
  Administered 2012-10-09: 12:00:00 via INTRAVENOUS

## 2012-10-09 MED ORDER — GLYCOPYRROLATE 0.2 MG/ML IJ SOLN
INTRAMUSCULAR | Status: DC | PRN
  Administered 2012-10-09: 0.4 mg via INTRAVENOUS

## 2012-10-09 MED ORDER — NEOSTIGMINE METHYLSULFATE 1 MG/ML IJ SOLN
INTRAMUSCULAR | Status: DC | PRN
  Administered 2012-10-09: 3 mg via INTRAVENOUS

## 2012-10-09 MED ORDER — HYDROMORPHONE HCL PF 1 MG/ML IJ SOLN
INTRAMUSCULAR | Status: AC
  Filled 2012-10-09: qty 1

## 2012-10-09 MED ORDER — ONDANSETRON HCL 4 MG PO TABS: 4.0000 mg | ORAL_TABLET | Freq: Four times a day (QID) | ORAL | Status: DC | PRN

## 2012-10-09 MED ORDER — ROPIVACAINE HCL 5 MG/ML IJ SOLN
INTRAMUSCULAR | Status: DC | PRN
  Administered 2012-10-09: 107 mL via EPIDURAL

## 2012-10-09 MED ORDER — INDIGOTINDISULFONATE SODIUM 8 MG/ML IJ SOLN
INTRAMUSCULAR | Status: DC | PRN
  Administered 2012-10-09: 5 mL via INTRAVENOUS

## 2012-10-09 MED ORDER — PROPOFOL 10 MG/ML IV EMUL
INTRAVENOUS | Status: DC | PRN
  Administered 2012-10-09: 200 mg via INTRAVENOUS

## 2012-10-09 MED ORDER — MIDAZOLAM HCL 5 MG/5ML IJ SOLN
INTRAMUSCULAR | Status: DC | PRN
  Administered 2012-10-09: 2 mg via INTRAVENOUS

## 2012-10-09 MED ORDER — MEPERIDINE HCL 25 MG/ML IJ SOLN: 6.2500 mg | INTRAMUSCULAR | Status: DC | PRN

## 2012-10-09 MED ORDER — SCOPOLAMINE 1 MG/3DAYS TD PT72: 1.0000 | MEDICATED_PATCH | TRANSDERMAL | Status: DC

## 2012-10-09 MED ORDER — METOCLOPRAMIDE HCL 5 MG/ML IJ SOLN: 10.0000 mg | Freq: Once | INTRAMUSCULAR | Status: DC | PRN

## 2012-10-09 MED ORDER — ONDANSETRON HCL 4 MG/2ML IJ SOLN
INTRAMUSCULAR | Status: AC
  Filled 2012-10-09: qty 2

## 2012-10-09 MED ORDER — LACTATED RINGERS IR SOLN
Status: DC | PRN
  Administered 2012-10-09: 3000 mL

## 2012-10-09 MED ORDER — ZOLPIDEM TARTRATE 5 MG PO TABS: 5.0000 mg | ORAL_TABLET | Freq: Every evening | ORAL | Status: DC | PRN

## 2012-10-09 MED ORDER — STERILE WATER FOR IRRIGATION IR SOLN
Status: DC | PRN
  Administered 2012-10-09: 1000 mL via INTRAVESICAL

## 2012-10-09 MED ORDER — ARTIFICIAL TEARS OP OINT
TOPICAL_OINTMENT | OPHTHALMIC | Status: AC
  Filled 2012-10-09: qty 3.5

## 2012-10-09 MED ORDER — ONDANSETRON HCL 4 MG/2ML IJ SOLN: 4.0000 mg | Freq: Four times a day (QID) | INTRAMUSCULAR | Status: DC | PRN

## 2012-10-09 MED ORDER — INDIGOTINDISULFONATE SODIUM 8 MG/ML IJ SOLN
INTRAMUSCULAR | Status: AC
  Filled 2012-10-09: qty 5

## 2012-10-09 MED ORDER — CEFAZOLIN SODIUM-DEXTROSE 2-3 GM-% IV SOLR
INTRAVENOUS | Status: AC
  Filled 2012-10-09: qty 50

## 2012-10-09 MED ORDER — HYDROMORPHONE HCL PF 1 MG/ML IJ SOLN
INTRAMUSCULAR | Status: AC
  Administered 2012-10-09: 0.5 mg via INTRAVENOUS
  Filled 2012-10-09: qty 1

## 2012-10-09 MED ORDER — NEOSTIGMINE METHYLSULFATE 1 MG/ML IJ SOLN
INTRAMUSCULAR | Status: AC
  Filled 2012-10-09: qty 1

## 2012-10-09 MED ORDER — KETOROLAC TROMETHAMINE 30 MG/ML IJ SOLN
INTRAMUSCULAR | Status: DC | PRN
  Administered 2012-10-09: 30 mg via INTRAVENOUS

## 2012-10-09 MED ORDER — ACETAMINOPHEN 10 MG/ML IV SOLN
INTRAVENOUS | Status: AC
  Filled 2012-10-09: qty 100

## 2012-10-09 MED ORDER — ONDANSETRON HCL 4 MG/2ML IJ SOLN
INTRAMUSCULAR | Status: DC | PRN
  Administered 2012-10-09: 4 mg via INTRAVENOUS

## 2012-10-09 MED ORDER — GLYCOPYRROLATE 0.2 MG/ML IJ SOLN
INTRAMUSCULAR | Status: AC
  Filled 2012-10-09: qty 3

## 2012-10-09 MED ORDER — ROPIVACAINE HCL 5 MG/ML IJ SOLN
INTRAMUSCULAR | Status: AC
  Filled 2012-10-09: qty 60

## 2012-10-09 MED ORDER — ACETAMINOPHEN 10 MG/ML IV SOLN
1000.0000 mg | Freq: Four times a day (QID) | INTRAVENOUS | Status: DC
  Administered 2012-10-09: 1000 mg via INTRAVENOUS
  Filled 2012-10-09: qty 100

## 2012-10-09 MED ORDER — SIMETHICONE 80 MG PO CHEW
80.0000 mg | CHEWABLE_TABLET | Freq: Four times a day (QID) | ORAL | Status: DC | PRN
Start: 2012-10-09 — End: 2012-10-10

## 2012-10-09 SURGICAL SUPPLY — 62 items
BAG URINE DRAINAGE (UROLOGICAL SUPPLIES) ×3 IMPLANT
BARRIER ADHS 3X4 INTERCEED (GAUZE/BANDAGES/DRESSINGS) IMPLANT
BENZOIN TINCTURE PRP APPL 2/3 (GAUZE/BANDAGES/DRESSINGS) ×3 IMPLANT
BLADE LAPAROSCOPIC MORCELL KIT (BLADE) ×3 IMPLANT
CATH FOLEY 3WAY  5CC 16FR (CATHETERS) ×1
CATH FOLEY 3WAY 5CC 16FR (CATHETERS) ×2 IMPLANT
CLOTH BEACON ORANGE TIMEOUT ST (SAFETY) ×3 IMPLANT
CONT PATH 16OZ SNAP LID 3702 (MISCELLANEOUS) ×3 IMPLANT
COVER MAYO STAND STRL (DRAPES) ×3 IMPLANT
COVER TABLE BACK 60X90 (DRAPES) ×6 IMPLANT
COVER TIP SHEARS 8 DVNC (MISCELLANEOUS) ×2 IMPLANT
COVER TIP SHEARS 8MM DA VINCI (MISCELLANEOUS) ×1
DECANTER SPIKE VIAL GLASS SM (MISCELLANEOUS) ×3 IMPLANT
DERMABOND ADVANCED (GAUZE/BANDAGES/DRESSINGS) ×1
DERMABOND ADVANCED .7 DNX12 (GAUZE/BANDAGES/DRESSINGS) ×2 IMPLANT
DEVICE TROCAR PUNCTURE CLOSURE (ENDOMECHANICALS) IMPLANT
DRAPE HUG U DISPOSABLE (DRAPE) ×3 IMPLANT
DRAPE LG THREE QUARTER DISP (DRAPES) ×6 IMPLANT
DRAPE WARM FLUID 44X44 (DRAPE) ×3 IMPLANT
ELECT REM PT RETURN 9FT ADLT (ELECTROSURGICAL) ×3
ELECTRODE REM PT RTRN 9FT ADLT (ELECTROSURGICAL) ×2 IMPLANT
EVACUATOR SMOKE 8.L (FILTER) ×3 IMPLANT
GAUZE VASELINE 3X9 (GAUZE/BANDAGES/DRESSINGS) IMPLANT
GLOVE BIO SURGEON STRL SZ 6.5 (GLOVE) ×6 IMPLANT
GLOVE ECLIPSE 6.5 STRL STRAW (GLOVE) ×9 IMPLANT
GOWN STRL REIN XL XLG (GOWN DISPOSABLE) ×24 IMPLANT
KIT ACCESSORY DA VINCI DISP (KITS) ×1
KIT ACCESSORY DVNC DISP (KITS) ×2 IMPLANT
LEGGING LITHOTOMY PAIR STRL (DRAPES) ×3 IMPLANT
MANIPULATOR UTERINE 4.5 ZUMI (MISCELLANEOUS) IMPLANT
NEEDLE INSUFFLATION 120MM (ENDOMECHANICALS) ×3 IMPLANT
NEEDLE SPNL 18GX3.5 QUINCKE PK (NEEDLE) IMPLANT
OCCLUDER COLPOPNEUMO (BALLOONS) ×3 IMPLANT
PACK LAVH (CUSTOM PROCEDURE TRAY) ×3 IMPLANT
PAD PREP 24X48 CUFFED NSTRL (MISCELLANEOUS) ×6 IMPLANT
PLUG CATH AND CAP STER (CATHETERS) ×3 IMPLANT
PROTECTOR NERVE ULNAR (MISCELLANEOUS) ×6 IMPLANT
SET CYSTO W/LG BORE CLAMP LF (SET/KITS/TRAYS/PACK) IMPLANT
SET IRRIG TUBING LAPAROSCOPIC (IRRIGATION / IRRIGATOR) ×3 IMPLANT
SOLUTION ELECTROLUBE (MISCELLANEOUS) ×3 IMPLANT
STRIP CLOSURE SKIN 1/2X4 (GAUZE/BANDAGES/DRESSINGS) ×3 IMPLANT
SUT VIC AB 0 CT1 27 (SUTURE) ×2
SUT VIC AB 0 CT1 27XBRD ANBCTR (SUTURE) ×4 IMPLANT
SUT VIC AB 0 CT1 27XBRD ANTBC (SUTURE) IMPLANT
SUT VICRYL 0 UR6 27IN ABS (SUTURE) ×6 IMPLANT
SUT VICRYL RAPIDE 4/0 PS 2 (SUTURE) ×6 IMPLANT
SUT VLOC 180 0 9IN  GS21 (SUTURE)
SUT VLOC 180 0 9IN GS21 (SUTURE) IMPLANT
SYR 50ML LL SCALE MARK (SYRINGE) ×3 IMPLANT
SYSTEM CONVERTIBLE TROCAR (TROCAR) IMPLANT
TIP RUMI ORANGE 6.7MMX12CM (TIP) IMPLANT
TIP UTERINE 5.1X6CM LAV DISP (MISCELLANEOUS) IMPLANT
TIP UTERINE 6.7X10CM GRN DISP (MISCELLANEOUS) IMPLANT
TIP UTERINE 6.7X6CM WHT DISP (MISCELLANEOUS) IMPLANT
TIP UTERINE 6.7X8CM BLUE DISP (MISCELLANEOUS) IMPLANT
TOWEL OR 17X24 6PK STRL BLUE (TOWEL DISPOSABLE) ×6 IMPLANT
TROCAR DILATING TIP 12MM 150MM (ENDOMECHANICALS) ×3 IMPLANT
TROCAR DISP BLADELESS 8 DVNC (TROCAR) ×2 IMPLANT
TROCAR DISP BLADELESS 8MM (TROCAR) ×1
TROCAR XCEL 12X100 BLDLESS (ENDOMECHANICALS) ×3 IMPLANT
TUBING FILTER THERMOFLATOR (ELECTROSURGICAL) ×3 IMPLANT
WATER STERILE IRR 1000ML POUR (IV SOLUTION) ×9 IMPLANT

## 2012-10-09 NOTE — Anesthesia Postprocedure Evaluation (Signed)
Anesthesia Post Note  Patient: Erin Ray  Procedure(s) Performed: Procedure(s) (LRB): ROBOTIC ASSISTED TOTAL HYSTERECTOMY (N/A) BILATERAL SALPINGECTOMY (Bilateral) CYSTOSCOPY (N/A)  Anesthesia type: General  Patient location: PACU  Post pain: Pain level controlled  Post assessment: Post-op Vital signs reviewed  Last Vitals:  Filed Vitals:   10/09/12 1715  BP: 118/66  Pulse: 70  Temp:   Resp: 18    Post vital signs: Reviewed  Level of consciousness: sedated  Complications: No apparent anesthesia complications

## 2012-10-09 NOTE — Transfer of Care (Signed)
Immediate Anesthesia Transfer of Care Note  Patient: Erin Ray  Procedure(s) Performed: Procedure(s): ROBOTIC ASSISTED TOTAL HYSTERECTOMY (N/A) BILATERAL SALPINGECTOMY (Bilateral) CYSTOSCOPY (N/A)  Patient Location: PACU  Anesthesia Type:General  Level of Consciousness: awake, alert  and oriented  Airway & Oxygen Therapy: Patient Spontanous Breathing and Patient connected to nasal cannula oxygen  Post-op Assessment: Report given to PACU RN and Post -op Vital signs reviewed and stable  Post vital signs: Reviewed and stable  Complications: No apparent anesthesia complications

## 2012-10-09 NOTE — H&P (Signed)
Erin Ray is an 32 y.o. female. She has long, heavy periods as well as daily pelvic pain, worse with periods.  She is here today for a RATH, etc to treat her symptomatic fibroids. She considered a [place] but after discussing it with the radiologist, she has decided to proceed with a hysterectomy.   Pertinent Gynecological History: Menses: flow is excessive with use of 8 pads or tampons on heaviest days Bleeding: monthly, 10 day periods Contraception: abstinence (last intercourse was 3 years ago) DES exposure: denies Blood transfusions: none Sexually transmitted diseases: no past history Previous GYN Procedures: DNC (8/13) Last mammogram: n/a Date:  Last pap: normal Date: 2013 OB History: G0   Menstrual History: Menarche age:39 No LMP recorded.    Past Medical History  Diagnosis Date  . Seizures     due to [facility] 2011  . Anemia   . GERD (gastroesophageal reflux disease)     hx of esophogeal dilatation 2011  . Fibroids   . Obesity   . West Nile fever meningitis 2011    Past Surgical History  Procedure Laterality Date  . Brain surgery  2011    hist of [place] , "brain swelling"  drained fluid  . Brain surgery      past [place] be [place] here by [facility]  . Esophageal dilation  2011  . Dilation and curettage of uterus      No family history on file.  Social History:  reports that she has never smoked. She has never used smokeless tobacco. She reports that she does not drink alcohol or use illicit drugs.  Allergies:  Allergies  Allergen Reactions  . Amoxicillin Hives  . Latex   . Penicillins Hives    Noted with PCN and Amox    Prescriptions prior to admission  Medication Sig Dispense Refill  . levETIRAcetam (KEPPRA) 500 MG tablet Take 500 mg by mouth every 12 (twelve) hours.      . pseudoephedrine (SUDAFED) 120 MG 12 hr tablet Take 120 mg by mouth every 12 (twelve) hours.      Marland Kitchen acetaminophen (TYLENOL) 325 MG tablet Take  650 mg by mouth every 6 (six) hours as needed. For pain      . calcium carbonate (TUMS - DOSED IN MG ELEMENTAL CALCIUM) 500 MG chewable tablet Chew 1 tablet by mouth as needed for heartburn.      . diphenhydrAMINE (BENADRYL) 12.5 MG/5ML liquid Take 25 mg by mouth 4 (four) times daily as needed for allergies (for cold).      Marland Kitchen erythromycin ophthalmic ointment Place 1 application into both eyes at bedtime.      . fluorometholone (FML) 0.1 % ophthalmic ointment Place 1 application into both eyes 4 (four) times daily.      Marland Kitchen guaifenesin (ROBITUSSIN) 100 MG/5ML syrup Take 200 mg by mouth 3 (three) times daily as needed for cough.      . loratadine (CLARITIN) 10 MG tablet Take 10 mg by mouth daily.      . methylergonovine (METHERGINE) 0.2 MG tablet Take 1 tablet (0.2 mg total) by mouth every 6 (six) hours.  6 tablet  0    ROS She is unemployed currently. She lives with her mother.  Blood pressure 125/69, pulse 74, temperature 97.9 F (36.6 C), temperature source Oral, resp. rate 18, SpO2 99.00%. Physical Exam Heart- rrr Lungs- CTAB Abd- benign  Results for orders placed during the hospital encounter of 10/09/12 (from the past 24  hour(s))  PREGNANCY, URINE     Status: None   Collection Time    10/09/12 11:20 AM      Result Value Range   Preg Test, Ur NEGATIVE  NEGATIVE    No results found.  Assessment/Plan: Symptomatic fibroids (pain and bleeding). I will plan for a RATH/ bilateral salpingectomy, and cystoscopy. She understands the risks of surgery, including, but not to infection, bleeding, DVTs, damage to bowel, bladder, ureters. She wishes to proceed. She understands that a laparotomy may be necessary.    Erin Ray C. 10/09/2012, 11:51 AM

## 2012-10-09 NOTE — Op Note (Signed)
10/09/2012  4:12 PM  PATIENT:  Erin Ray  32 y.o. female  PRE-OPERATIVE DIAGNOSIS:  Symptomatic Fibroids; Menorrhagia; Pelvic Pain   POST-OPERATIVE DIAGNOSIS:  Symptomatic Fibroids; Menorrhagia; Pelvic Pain   PROCEDURE:  Procedure(s): ROBOTIC ASSISTED TOTAL HYSTERECTOMY (N/A) BILATERAL SALPINGECTOMY (Bilateral) CYSTOSCOPY (N/A)  SURGEON:  Surgeon(s) and Role:    * Allie Bossier, MD - Primary     PHYSICIAN ASSISTANT:   ASSISTANTS: Tinnie Gens, MD   ANESTHESIA:   general  EBL:  Total I/O In: 1000 [I.V.:1000] Out: 250 [Urine:150; Blood:100]  BLOOD ADMINISTERED:none  DRAINS: none   LOCAL MEDICATIONS USED:  NONE  SPECIMEN:  Source of Specimen:  uterus and oviducts  DISPOSITION OF SPECIMEN:  PATHOLOGY  COUNTS:  YES  TOURNIQUET:  * No tourniquets in log *  DICTATION: .Dragon Dictation  PLAN OF CARE: Admit for overnight observation  PATIENT DISPOSITION:  PACU - hemodynamically stable.   Delay start of Pharmacological VTE agent (>24hrs) due to surgical blood loss or risk of bleeding: not applicable  The risks, benefits, and alternatives of surgery were explained, understood, and accepted. Consents were signed. All questions were answered. She was taken to the operating room and general anesthesia was applied without complication. She was placed in the dorsal lithotomy position and her abdomen and vagina were prepped and draped after she had been carefully positioned on the table. A bimanual exam revealed a 12 week size uterus that was mobile. Her adnexa were not enlarged. The cervix was measured and the uterus was sounded to 11 cm. A Rumi uterine manipulator was placed without difficulty. A Foley catheter was placed and it drained clear throughout the case. Gloves were changed and attention was turned to the abdomen. A vertical supra- umbilical incision was made and a Veress needle was placed intraperitoneally. CO2 was used to insufflate the abdomen to approximately 3L.  After good pneumoperitoneum was established, a 12 mm trocar was placed. Laparoscopy confirmed correct placement. She was placed in Trendelenburg position and ports were placed in appropriate positions on her abdomen to allow maximum exposure during the robotic case. Specifically there was an 12 mm assistant port placed in the right lower quadrant under direct laproscopic visualization. A 8 mm port was placed about 10 cm lateral to the umbilical port on each side, and a third 8 mm port in the LLQ under direct laparoscopic visualization. Prior to all incisions, each site was injected with ropivicaine. The robot was attached to the ports. It was apparent that the second and third arms were colliding, so I replaced the 8 mm port in the LLQ to slightly more lateral.  I  Then proceeded with a robotic portion of the case. The pelvis was inspected and an and an alpha manner.  There were several large (6 cm) fundal fibroids. The remainder of her pelvis appeared normal.The ureters and the infundibulopelvic ligaments were identified. The uteroovarian ligament on each side was cauterized and cut. The PK/gyrus instrument was used for this portion. The round ligaments were identified, cauterized and ligated. A bladder flap was created anteriorly. The uterine vessels were identified and cauterized and then cut.The bladder was pushed out of the operative site and an anterior colpotomy was made. The colpotomy incision was extended circumferentially, following the blue outline of the Rumi manipulator. All pedicles were hemostatic. I excised both oviducts at this point The uterus was morcellated after the 12 mm assistant port was enlarged enough to allow the placement of the Gynecare morcellator. The vaginal cuff was  closed with  0 Vicryl  V lock sutrue. Excellent hemostasis was noted throughout. The pelvis was irrigated. At this point I performed cystoscopy. The cystoscopy revealed blue ejection from both ureters. Through the  laparoscope I was able to see the ureters functioning normally and appearing of normal caliber. At this point we used the laparoscopic fascial closure device on the 15 mm assistant port site. The CO2 was allowed to escape from the abdomen and the remainder of the ports were removed. The fascia at the umbilicus was elevated and closed in a figure-of-eight fashion with a 0 Vicryl suture. A subcuticular closure was done at all incision sites with 4-0 Vicryl suture. She was extubated and taken to recovery in stable condition.

## 2012-10-09 NOTE — Anesthesia Preprocedure Evaluation (Signed)
Anesthesia Evaluation  Patient identified by MRN, date of birth, ID band Patient awake    Reviewed: Allergy & Precautions, H&P , NPO status , Patient's Chart, lab work & pertinent test results  Airway Mallampati: III TM Distance: >3 FB Neck ROM: Full    Dental no notable dental hx. (+) Teeth Intact   Pulmonary  breath sounds clear to auscultation  Pulmonary exam normal       Cardiovascular negative cardio ROS  Rhythm:Regular Rate:Normal     Neuro/Psych Seizures -, Well Controlled,  Hx/o West Nile Meningitis 2010 negative psych ROS   GI/Hepatic Neg liver ROS, GERD-  Medicated and Controlled,  Endo/Other  negative endocrine ROS  Renal/GU negative Renal ROS  negative genitourinary   Musculoskeletal negative musculoskeletal ROS (+)   Abdominal   Peds  Hematology  (+) Blood dyscrasia, anemia ,   Anesthesia Other Findings   Reproductive/Obstetrics Menorrhagia Pelvic pain Anemia Uterine Leiomyoma                           Anesthesia Physical Anesthesia Plan  ASA: II  Anesthesia Plan: General   Post-op Pain Management:    Induction: Intravenous  Airway Management Planned: Oral ETT  Additional Equipment:   Intra-op Plan:   Post-operative Plan: Extubation in OR  Informed Consent: I have reviewed the patients History and Physical, chart, labs and discussed the procedure including the risks, benefits and alternatives for the proposed anesthesia with the patient or authorized representative who has indicated his/her understanding and acceptance.   Dental advisory given  Plan Discussed with: Anesthesiologist, CRNA and Surgeon  Anesthesia Plan Comments:         Anesthesia Quick Evaluation

## 2012-10-09 NOTE — Anesthesia Procedure Notes (Signed)
Procedure Name: Intubation Date/Time: 10/09/2012 12:50 PM Performed by: Shanon Payor Pre-anesthesia Checklist: Suction available, Emergency Drugs available, Timeout performed, Patient identified and Patient being monitored Patient Re-evaluated:Patient Re-evaluated prior to inductionOxygen Delivery Method: Circle system utilized Preoxygenation: Pre-oxygenation with 100% oxygen Intubation Type: IV induction Ventilation: Mask ventilation without difficulty Laryngoscope Size: Mac and 3 Grade View: Grade III Tube type: Oral Tube size: 7.0 mm Number of attempts: 2 Airway Equipment and Method: Stylet Placement Confirmation: ETT inserted through vocal cords under direct vision,  positive ETCO2 and breath sounds checked- equal and bilateral Secured at: 21 cm Dental Injury: Teeth and Oropharynx as per pre-operative assessment  Difficulty Due To: Difficulty was anticipated and Difficult Airway- due to anterior larynx Comments: DL x 1 by CRNA, poor visualization of cords. DL x 1 by MD, able to intubate successfully. Positive ETCO2, bilat. Equal breath sounds heard on auscultation.,

## 2012-10-10 ENCOUNTER — Encounter (HOSPITAL_COMMUNITY): Payer: Self-pay | Admitting: Obstetrics & Gynecology

## 2012-10-10 DIAGNOSIS — N949 Unspecified condition associated with female genital organs and menstrual cycle: Secondary | ICD-10-CM

## 2012-10-10 DIAGNOSIS — N920 Excessive and frequent menstruation with regular cycle: Secondary | ICD-10-CM

## 2012-10-10 DIAGNOSIS — D259 Leiomyoma of uterus, unspecified: Secondary | ICD-10-CM

## 2012-10-10 DIAGNOSIS — D219 Benign neoplasm of connective and other soft tissue, unspecified: Secondary | ICD-10-CM

## 2012-10-10 DIAGNOSIS — N838 Other noninflammatory disorders of ovary, fallopian tube and broad ligament: Secondary | ICD-10-CM

## 2012-10-10 LAB — CBC
MCH: 27.1 pg (ref 26.0–34.0)
MCHC: 33.1 g/dL (ref 30.0–36.0)
MCV: 81.9 fL (ref 78.0–100.0)
Platelets: 316 10*3/uL (ref 150–400)
RDW: 13.4 % (ref 11.5–15.5)
WBC: 13.5 10*3/uL — ABNORMAL HIGH (ref 4.0–10.5)

## 2012-10-10 MED ORDER — IBUPROFEN 800 MG PO TABS: 800.0000 mg | ORAL_TABLET | Freq: Three times a day (TID) | ORAL | Status: DC | PRN

## 2012-10-10 MED ORDER — OXYCODONE-ACETAMINOPHEN 5-325 MG PO TABS: 1.0000 | ORAL_TABLET | ORAL | Status: DC | PRN

## 2012-10-10 NOTE — Discharge Summary (Signed)
Physician Discharge Summary  Patient ID: DEMRI POULTON MRN: [id number] DOB/AGE: 12/30/1980 31 y.o.  Admit date: 10/09/2012 Discharge date: 10/10/2012  Admission Diagnoses: symptomatic fibroids  Discharge Diagnoses: same Active Problems:   * No active hospital problems. *   Discharged Condition: good  Hospital Course: She underwent an uncomplicated RATH/bilateral salpingectomy, and cystoscopy with minimal blood loss. By POD #1 she was ambulating, voiding, tolerating po without nausea or vomiting. She voiced her readiness to go home. Her post op Hbg was 11.7 (pre op 12.7).  Consults: None  Significant Diagnostic Studies: labs: as above  Treatments: surgery: as above  Discharge Exam: Blood pressure 112/68, pulse 77, temperature 97.7 F (36.5 C), temperature source Oral, resp. rate 17, height 5\' 2"  (1.575 m), weight 77.202 kg (170 lb 3.2 oz), SpO2 99.00%. General appearance: alert Resp: clear to auscultation bilaterally Cardio: regular rate and rhythm, S1, S2 normal, no murmur, click, rub or gallop GI: soft, non-tender; bowel sounds normal; no masses,  no organomegaly Incisions: c/d/i  Disposition: 01-Home or Self Care     Medication List    STOP taking these medications       acetaminophen 325 MG tablet  Commonly known as:  TYLENOL      TAKE these medications       calcium carbonate 500 MG chewable tablet  Commonly known as:  TUMS - dosed in mg elemental calcium  Chew 1 tablet by mouth as needed for heartburn.     diphenhydrAMINE 12.5 MG/5ML liquid  Commonly known as:  BENADRYL  Take 25 mg by mouth 4 (four) times daily as needed for allergies (for cold).     erythromycin ophthalmic ointment  Place 1 application into both eyes at bedtime.     fluorometholone 0.1 % ophthalmic ointment  Commonly known as:  FML  Place 1 application into both eyes 4 (four) times daily.     guaifenesin 100 MG/5ML syrup  Commonly known as:  ROBITUSSIN  Take 200 mg by mouth 3  (three) times daily as needed for cough.     ibuprofen 800 MG tablet  Commonly known as:  ADVIL,MOTRIN  Take 1 tablet (800 mg total) by mouth every 8 (eight) hours as needed (mild pain).     levETIRAcetam 500 MG tablet  Commonly known as:  KEPPRA  Take 500 mg by mouth every 12 (twelve) hours.     loratadine 10 MG tablet  Commonly known as:  CLARITIN  Take 10 mg by mouth daily.     methylergonovine 0.2 MG tablet  Commonly known as:  METHERGINE  Take 1 tablet (0.2 mg total) by mouth every 6 (six) hours.     oxyCODONE-acetaminophen 5-325 MG per tablet  Commonly known as:  PERCOCET/ROXICET  Take 1-2 tablets by mouth every 4 (four) hours as needed.     pseudoephedrine 120 MG 12 hr tablet  Commonly known as:  SUDAFED  Take 120 mg by mouth every 12 (twelve) hours.           Follow-up Information   Follow up with Hilbert Briggs C., MD. Schedule an appointment as soon as possible for a visit in 4 weeks.   Contact information:   [address] [place] [address] [phone]       Signed: Allie Bossier 10/10/2012, 9:14 AM

## 2012-10-10 NOTE — Anesthesia Postprocedure Evaluation (Signed)
Anesthesia Post Note  Patient: Erin Ray  Procedure(s) Performed: Procedure(s) (LRB): ROBOTIC ASSISTED TOTAL HYSTERECTOMY (N/A) BILATERAL SALPINGECTOMY (Bilateral) CYSTOSCOPY (N/A)  Anesthesia type: General  Patient location: Mother/Baby  Post pain: Pain level controlled  Post assessment: Post-op Vital signs reviewed  Last Vitals:  Filed Vitals:   10/10/12 0558  BP: 112/68  Pulse: 77  Temp: 36.5 C  Resp: 17    Post vital signs: Reviewed  Level of consciousness: awake and alert   Complications: No apparent anesthesia complications

## 2012-10-15 ENCOUNTER — Telehealth: Payer: Self-pay

## 2012-10-15 NOTE — Telephone Encounter (Addendum)
Patient's mother, Erin Ray, called in concerned about some blisters that have developed after her daughter Erin Ray hysterectomy.  She states the blisters are not where the incision is.  Her surgery was 10/09/12 by Dr. Marice Potter.  Erin Ray was insistent that we NOT call her daughter about this because her daughter gets "very freaked out" any time a nurse calls her.  Erin Ray made it unmistakably clear DO NOT CALL MY DAUGHTER, YOU CALL ME.  I verified that Erin Ray is listed on her HIPPA form as a person who is able to receive information about [name].  It is under the media tab dated 07/23/12.  Left message for patients mother on her work voicemail to have her give [place] a call back.  Spoke with Erin Ray who said the blisters, some of which have popped, are located above and below the patient incision.  She has no history of an adhesive allergy but it appears to be where her dressing was taped.  She has applied neosporin to the area.  The patient is also complaining of a lot of pain.  I informed Erin Ray what it means to splint an abdominal incision and I also suggested getting an abdominal binder for support.  I reinforced the importance of walking, using her incentive spirometer, and drinking fluids. I told her I would speak with a Dr. About the blisters and give her a call back.

## 2012-10-23 NOTE — Telephone Encounter (Signed)
Spoke with Ms. [name] (patients mother) to see if the blisters had resolved, which they had.  It appears to have been a sensitivity to the adhesive used on her dressings.  The patient is doing well, still has some swelling and a little bit of bleeding but the blisters have cleared up and she is getting around better.  I informed her of her next appointment on 11/06/12.  She had no more questions and was very satisfied.

## 2012-10-28 ENCOUNTER — Encounter: Payer: Self-pay | Admitting: *Deleted

## 2012-11-06 ENCOUNTER — Other Ambulatory Visit (HOSPITAL_COMMUNITY)
Admission: RE | Admit: 2012-11-06 | Discharge: 2012-11-06 | Disposition: A | Discharge status: Home / Self Care | Payer: Medicare Other | Source: Ambulatory Visit | Attending: Obstetrics & Gynecology | Admitting: Obstetrics & Gynecology

## 2012-11-06 ENCOUNTER — Encounter: Payer: Self-pay | Admitting: Obstetrics & Gynecology

## 2012-11-06 ENCOUNTER — Ambulatory Visit: Payer: Medicare Other | Admitting: Obstetrics & Gynecology

## 2012-11-06 VITALS — BP 115/73 | HR 81 | Temp 97.4°F | Ht 62.0 in | Wt 170.4 lb

## 2012-11-06 DIAGNOSIS — N898 Other specified noninflammatory disorders of vagina: Secondary | ICD-10-CM

## 2012-11-06 DIAGNOSIS — N760 Acute vaginitis: Secondary | ICD-10-CM | POA: Insufficient documentation

## 2012-11-06 DIAGNOSIS — N72 Inflammatory disease of cervix uteri: Secondary | ICD-10-CM | POA: Insufficient documentation

## 2012-11-06 DIAGNOSIS — N7689 Other specified inflammation of vagina and vulva: Secondary | ICD-10-CM | POA: Insufficient documentation

## 2012-11-06 NOTE — Progress Notes (Signed)
  Subjective:    Patient ID: Erin Ray, female    DOB: Apr 23, 1981, 32 y.o.   MRN: [id number]  HPI  31 yo AA lady 4 weeks s/p RATH/bilateral salpingectomy. She has had some spotting and reports normal bowel and bladder function. She continues to have the same preop pain in her right upper quadrant. She has been abstinent for about 3 years.  Review of Systems     Objective:   Physical Exam  Completely healed incisions and vaginal cuff 1 cm necrotic lesion on right side of vagina about 1-2 inches away from the cuff I used a ring forcep to twist the tissue off and used silver nitrate to achieve hemostasis.     Assessment & Plan:  Post op stable Vaginal lesion- I sent the lesion to pathology RTC 4 weeks

## 2012-12-10 ENCOUNTER — Encounter: Payer: Self-pay | Admitting: Obstetrics & Gynecology

## 2012-12-10 ENCOUNTER — Ambulatory Visit: Payer: Medicare Other | Admitting: Obstetrics & Gynecology

## 2012-12-10 VITALS — BP 128/86 | HR 80 | Temp 97.3°F | Ht 62.0 in | Wt 167.3 lb

## 2012-12-10 DIAGNOSIS — Z09 Encounter for follow-up examination after completed treatment for conditions other than malignant neoplasm: Secondary | ICD-10-CM

## 2012-12-10 NOTE — Progress Notes (Signed)
  Subjective:    Patient ID: Erin Ray, female    DOB: September 11, 1980, 32 y.o.   MRN: [id number]  HPI  She is now 8 weeks post op. She is having no problems. She has not had sex since surgery.  Review of Systems     Objective:   Physical Exam  Well-healed incisions, vaginal cuff. Granulation tissue on side wall gone Normal bimanual exam      Assessment & Plan:   Post op- doing well RTC 1 year/prn sooner

## 2013-12-29 ENCOUNTER — Other Ambulatory Visit: Payer: Self-pay | Admitting: Obstetrics and Gynecology

## 2013-12-30 LAB — CYTOLOGY - PAP

## 2014-04-07 ENCOUNTER — Ambulatory Visit: Payer: Medicare Other | Admitting: Gastroenterology

## 2014-10-22 ENCOUNTER — Ambulatory Visit (INDEPENDENT_AMBULATORY_CARE_PROVIDER_SITE_OTHER): Payer: Medicare Other | Admitting: Obstetrics & Gynecology

## 2014-10-22 VITALS — BP 118/74 | HR 67 | Temp 97.8°F | Ht 62.0 in | Wt 137.5 lb

## 2014-10-22 DIAGNOSIS — R102 Pelvic and perineal pain: Secondary | ICD-10-CM | POA: Diagnosis present

## 2014-10-22 NOTE — Progress Notes (Signed)
   Subjective:    Patient ID: Erin Ray, female    DOB: Dec 14, 1980, 34 y.o.   MRN: [id number]  HPI  34 yo SAA P0 here 2 years after a RATH/BS with the complaint of cyclic (about a week per month) of abdominal bloating and discomfort. She is not sexually active.  Review of Systems She has seen a GI about chronic constipation, but in the last 2 weeks she is having a BM mostly daily.   She has lost 40# with diet and exercise. Objective:   Physical Exam [id number], pleasant Breathing and ambulating normally Her bimanual exam revealed no masses or tenderness      Assessment & Plan:  Cyclic abdominal pain- I suspect that this is ovulation pain, but I will get an u/s for her reassurance.

## 2014-11-01 ENCOUNTER — Ambulatory Visit (HOSPITAL_COMMUNITY)
Admission: RE | Admit: 2014-11-01 | Discharge: 2014-11-01 | Disposition: A | Discharge status: Home / Self Care | Payer: Medicare Other | Source: Ambulatory Visit | Attending: Obstetrics & Gynecology | Admitting: Obstetrics & Gynecology

## 2014-11-01 DIAGNOSIS — R102 Pelvic and perineal pain: Secondary | ICD-10-CM | POA: Insufficient documentation

## 2014-11-01 DIAGNOSIS — R140 Abdominal distension (gaseous): Secondary | ICD-10-CM | POA: Insufficient documentation

## 2014-11-24 ENCOUNTER — Ambulatory Visit (INDEPENDENT_AMBULATORY_CARE_PROVIDER_SITE_OTHER): Payer: Medicare Other | Admitting: Obstetrics & Gynecology

## 2014-11-24 ENCOUNTER — Encounter: Payer: Self-pay | Admitting: Obstetrics & Gynecology

## 2014-11-24 VITALS — BP 123/75 | HR 65 | Temp 98.4°F | Resp 20 | Ht 62.0 in | Wt 134.6 lb

## 2014-11-24 DIAGNOSIS — R102 Pelvic and perineal pain: Secondary | ICD-10-CM

## 2014-11-24 DIAGNOSIS — N940 Mittelschmerz: Secondary | ICD-10-CM | POA: Diagnosis not present

## 2014-11-24 MED ORDER — LEVONORGEST-ETH ESTRAD 91-DAY 0.15-0.03 &0.01 MG PO TABS: 1.0000 | ORAL_TABLET | Freq: Every day | ORAL | Status: DC

## 2014-11-24 NOTE — Progress Notes (Signed)
   Subjective:    Patient ID: Erin Ray, female    DOB: Jan 24, 1981, 34 y.o.   MRN: [id number]  HPI  This 34 yo lady is here to discuss her u/s results (all normal Erin Ray!). This was done to evaluate what sounds like Erin Ray pain. I wanted to make sure there was not u/s abnormality.   Review of Systems     Objective:   Physical Exam [id number] Breathing and ambulating normally       Assessment & Plan:  Cyclic pain/Erin Ray- rec OCPs to suppress ovulation RTC 6 weeks for BP check and follow up

## 2014-11-24 NOTE — Progress Notes (Signed)
Pt here for follow up after [place] on 11/01/14.

## 2014-11-25 ENCOUNTER — Telehealth: Payer: Self-pay

## 2014-11-25 MED ORDER — NORGESTIMATE-ETH ESTRADIOL 0.25-35 MG-MCG PO TABS: 1.0000 | ORAL_TABLET | Freq: Every day | ORAL | Status: DC

## 2014-11-25 NOTE — Telephone Encounter (Signed)
Patient called stating RX for OCP's that was prescribed yesterday is not covered by patient's insurance. Requests new RX sent to pharmacy. Consulted Kerry Hough, NP who reviewed current RX and stated can try Sprintec. Sprintec ordered. Called patient and informed her RX at pharmacy and advised she call if medication is not covered/affordable.

## 2015-05-20 ENCOUNTER — Encounter: Payer: Self-pay | Admitting: Physician Assistant

## 2015-06-06 ENCOUNTER — Encounter: Payer: Self-pay | Admitting: Physician Assistant

## 2015-06-06 ENCOUNTER — Ambulatory Visit (INDEPENDENT_AMBULATORY_CARE_PROVIDER_SITE_OTHER): Payer: Medicare Other | Admitting: Physician Assistant

## 2015-06-06 VITALS — BP 94/70 | HR 64 | Ht 62.0 in | Wt 135.4 lb

## 2015-06-06 DIAGNOSIS — K219 Gastro-esophageal reflux disease without esophagitis: Secondary | ICD-10-CM | POA: Diagnosis not present

## 2015-06-06 DIAGNOSIS — R1310 Dysphagia, unspecified: Secondary | ICD-10-CM

## 2015-06-06 MED ORDER — FAMOTIDINE 20 MG PO TABS: 20.0000 mg | ORAL_TABLET | Freq: Two times a day (BID) | ORAL | Status: DC

## 2015-06-06 NOTE — Progress Notes (Signed)
Patient ID: Erin Ray, female   DOB: 1980-11-08, 34 y.o.   MRN: [id number]    HPI:  Erin Ray is a 34 y.o.   female  referred by Erin Jewel, MD for evaluation of dysphagia. 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 Her Keppra was discontinued 2 years ago. She has a history of fibroids and has had a total hysterectomy.  Patient reports she has had a history of GERD for quite some time. Through the years she states she has tried "every PPI". She states that the PPIs cause her to have the sensation of swelling in her throat and she has not been able to tolerate them. More recently she has been trying vinegar, mustard, and various home remedies with no relief. She gets heartburn on a daily basis. She gets frequent nocturnal regurgitation. Her voice is very raspy in her throat always feel scratchy. She has been experiencing dysphagia to solids that has been becoming progressively more severe over the past several months. She states she had an upper endoscopy in [place] in 2011 with an esophageal dilation. She states she felt better for a period of time but for the past year and a half has been having progressive dysphagia. She reports that she has numerous environmental allergies and feels that her dysphagia is often worse during peak allergy season. She has never seen an allergist. Her bowel movements have been normal. Her paternal grandmother had colon cancer. Her appetite as been good and her weight has been stable.   Past Medical History  Diagnosis Date  . Seizures ([place])     due to [facility] 2011  . GERD (gastroesophageal reflux disease)     hx of esophogeal dilatation 2011  . Fibroids   . Obesity   . West Nile fever meningitis 2011  . Anemia     prior to hysterectomy in 2014    Past Surgical History  Procedure  Laterality Date  . Brain surgery  2011    hist of [place] , "brain swelling"  drained fluid  . Brain surgery      past [place], will be followed here by [facility]  . Esophageal dilation  2011  . Dilation and curettage of uterus    . Robotic assisted total hysterectomy N/A 10/09/2012    Procedure: ROBOTIC ASSISTED TOTAL HYSTERECTOMY;  Surgeon: Emily Filbert, MD;  Location: [facility];  Service: Gynecology;  Laterality: N/A;  . Bilateral salpingectomy Bilateral 10/09/2012    Procedure: BILATERAL SALPINGECTOMY;  Surgeon: Emily Filbert, MD;  Location: [facility];  Service: Gynecology;  Laterality: Bilateral;  . Cystoscopy N/A 10/09/2012    Procedure: CYSTOSCOPY;  Surgeon: Emily Filbert, MD;  Location: [facility];  Service: Gynecology;  Laterality: N/A;  . Multiple tooth extractions     Family History  Problem Relation Age of Onset  . Hypertension Mother   . Breast cancer Paternal Grandmother   . Colon cancer Paternal Grandmother   . Lung cancer Maternal Grandmother   . Lung cancer Paternal Grandfather    Social History  Substance Use Topics  . Smoking status: Former Smoker    Types: Cigarettes    Quit date: 07/02/2004  . Smokeless tobacco: Never Used  . Alcohol Use: No     Comment: stopped 3 yrs  ago   Current Outpatient Prescriptions  Medication Sig Dispense Refill  . AMBULATORY NON FORMULARY MEDICATION S[place] Bark 1 tablet by mouth every day    . calcium carbonate (TUMS - DOSED IN MG ELEMENTAL CALCIUM) 500 MG chewable tablet Chew 1 tablet by mouth as needed for heartburn.    . diphenhydrAMINE (BENADRYL) 12.5 MG/5ML liquid Take 25 mg by mouth 4 (four) times daily as needed for allergies (for cold).    Marland Kitchen guaifenesin (ROBITUSSIN) 100 MG/5ML syrup Take 200 mg by mouth 3 (three) times daily as needed for cough.    Marland Kitchen ibuprofen (ADVIL,MOTRIN) 800 MG tablet Take 1 tablet (800 mg total) by mouth every 8 (eight) hours as needed (mild pain). 60 tablet 1  . Multiple Vitamin  (MULTIVITAMIN) tablet Take 1 tablet by mouth daily.    . pseudoephedrine (SUDAFED) 120 MG 12 hr tablet Take 120 mg by mouth every 12 (twelve) hours.     No current facility-administered medications for this visit.   Allergies  Allergen Reactions  . Amoxicillin Hives  . Latex   . Penicillins Hives    Noted with PCN and Amox     Review of Systems: Gen: Denies any fever, chills, sweats, anorexia, fatigue, weakness, malaise, weight loss, and sleep disorder CV: Denies chest pain, angina, palpitations, syncope, orthopnea, PND, peripheral edema, and claudication. Resp: Denies dyspnea at rest, dyspnea with exercise, cough, sputum, wheezing, coughing up blood, and pleurisy. GI: Denies vomiting blood, jaundice, and fecal incontinence.   Admits to dysphagia to solids. GU : Denies urinary burning, blood in urine, urinary frequency, urinary hesitancy, nocturnal urination, and urinary incontinence. MS: Denies joint pain, limitation of movement, and swelling, stiffness, low back pain, extremity pain. Denies muscle weakness, cramps, atrophy.  Derm: Denies rash, itching, dry skin, hives, moles, warts, or unhealing ulcers.  Psych: Denies depression, anxiety, memory loss, suicidal ideation, hallucinations, paranoia, and confusion. Heme: Denies bruising, bleeding, and enlarged lymph nodes. Neuro:  Denies any headaches, dizziness, paresthesias. Endo:  Denies any problems with DM, thyroid, adrenal function    Physical Exam: BP 94/70 mmHg  Pulse 64  Ht 5\' 2"  (1.575 m)  Wt 135 lb 6 oz (61.406 kg)  BMI 24.75 kg/m2  LMP 08/13/2012 Constitutional: Pleasant,well-developed, African-American female in no acute distress. HEENT: Normocephalic and atraumatic. Conjunctivae are normal. No scleral icterus. Neck supple. No JVD Cardiovascular: Normal rate, regular rhythm.  Pulmonary/chest: Effort normal and breath sounds normal. No wheezing, rales or rhonchi. Abdominal: Soft, nondistended, nontender. Bowel sounds  active throughout. There are no masses palpable. No hepatomegaly. Extremities: no edema Lymphadenopathy: No cervical adenopathy noted. Neurological: Alert and oriented to person place and time. Skin: Skin is warm and dry. No rashes noted. Psychiatric: Normal mood and affect. Behavior is normal.  ASSESSMENT AND PLAN: 33 year old female with a long-standing history of GERD presenting with 1-1-1/2 years of progressive dysphagia to solids. Patient has had an EGD with dilation in [place] around 2011. She has signed a medical release to obtain these records. In the meantime, an antireflux regimen has been reviewed. She will be given a trial of famotidine 20 mg twice a day. Esophagram with barium tablet will be obtained. She will then be scheduled for an EGD with possible dilation.The risks, benefits, and alternatives to endoscopy with possible biopsy and possible dilation were discussed with the patient and they consent to proceed.  The procedure will be scheduled with Dr. Carlean Purl. Further recommendations will be made pending the findings of the above.  Kimberly Coye, Vita Barley PA-C 06/06/2015, 9:06 AM  CC: Erin Jewel, MD

## 2015-06-06 NOTE — Patient Instructions (Addendum)
You have been scheduled for a Barium Esophogram at [facility] (1st floor of the hospital) on 06-10-2015 at 10:30am. Please arrive 15 minutes prior to your appointment for registration. Make certain not to have anything to eat or drink 3 hours prior to your test. If you need to reschedule for any reason, please contact radiology at [phone] to do so. __________________________________________________________________ A barium swallow is an examination that concentrates on views of the esophagus. This tends to be a double contrast exam (barium and two liquids which, when combined, create a gas to distend the wall of the oesophagus) or single contrast (non-ionic iodine based). The study is usually tailored to your symptoms so a good history is essential. Attention is paid during the study to the form, structure and configuration of the esophagus, looking for functional disorders (such as aspiration, dysphagia, achalasia, motility and reflux) EXAMINATION You may be asked to change into a gown, depending on the type of swallow being performed. A radiologist and radiographer will perform the procedure. The radiologist will advise you of the type of contrast selected for your procedure and direct you during the exam. You will be asked to stand, sit or lie in several different positions and to hold a small amount of fluid in your mouth before being asked to swallow while the imaging is performed .In some instances you may be asked to swallow barium coated marshmallows to assess the motility of a solid food bolus. The exam can be recorded as a digital or video fluoroscopy procedure. POST PROCEDURE It will take 1-2 days for the barium to pass through your system. To facilitate this, it is important, unless otherwise directed, to increase your fluids for the next 24-48hrs and to resume your normal diet.  This test typically takes about 30 minutes to  perform. __________________________________________________________________________________   We have sent the following medications to your pharmacy for you to pick up at your convenience: Famotidine 20mg  one twice a day   You have been scheduled for an endoscopy. Please follow written instructions given to you at your visit today. If you use inhalers (even only as needed), please bring them with you on the day of your procedure. Your physician has requested that you go to www.startemmi.com and enter the access code given to you at your visit today. This web site gives a general overview about your procedure. However, you should still follow specific instructions given to you by our office regarding your preparation for the procedure.

## 2015-06-06 NOTE — Progress Notes (Addendum)
I went into Care Everywhere and saw that 2011 Ba swallow, MBS, EGd and ENT evaluations were all normal  So agree with Ba swallow but if that is neg then she needs an esophageal mano and not an EGD  Gatha Mayer, MD, [facility]   Upon further discussion w/ pt re: dysphagia decided to proceed w/ EGD

## 2015-06-10 ENCOUNTER — Ambulatory Visit (HOSPITAL_COMMUNITY)
Admission: RE | Admit: 2015-06-10 | Discharge: 2015-06-10 | Disposition: A | Discharge status: Home / Self Care | Payer: Medicare Other | Source: Ambulatory Visit | Attending: Physician Assistant | Admitting: Physician Assistant

## 2015-06-10 DIAGNOSIS — K219 Gastro-esophageal reflux disease without esophagitis: Secondary | ICD-10-CM

## 2015-06-10 DIAGNOSIS — R1310 Dysphagia, unspecified: Secondary | ICD-10-CM | POA: Diagnosis not present

## 2015-06-23 ENCOUNTER — Ambulatory Visit (AMBULATORY_SURGERY_CENTER): Payer: Medicare Other | Admitting: Internal Medicine

## 2015-06-23 ENCOUNTER — Encounter: Payer: Self-pay | Admitting: Internal Medicine

## 2015-06-23 VITALS — BP 129/49 | HR 85 | Temp 98.2°F | Resp 18

## 2015-06-23 DIAGNOSIS — R1310 Dysphagia, unspecified: Secondary | ICD-10-CM

## 2015-06-23 DIAGNOSIS — K219 Gastro-esophageal reflux disease without esophagitis: Secondary | ICD-10-CM

## 2015-06-23 DIAGNOSIS — K2970 Gastritis, unspecified, without bleeding: Secondary | ICD-10-CM

## 2015-06-23 DIAGNOSIS — K2950 Unspecified chronic gastritis without bleeding: Secondary | ICD-10-CM

## 2015-06-23 DIAGNOSIS — K2990 Gastroduodenitis, unspecified, without bleeding: Secondary | ICD-10-CM | POA: Diagnosis not present

## 2015-06-23 MED ORDER — SODIUM CHLORIDE 0.9 % IV SOLN: 500.0000 mL | INTRAVENOUS | Status: DC

## 2015-06-23 NOTE — Op Note (Signed)
[facility]  [address] [place], [address]   ENDOSCOPY PROCEDURE REPORT  PATIENT: Erin, Ray  MR#: [id number] BIRTHDATE: 09/14/1980 , 34  yrs. old GENDER: female ENDOSCOPIST: Gatha Mayer, MD, [facility] PROCEDURE DATE:  06/23/2015 PROCEDURE:  EGD w/ balloon dilation and EGD w/ biopsy ASA CLASS:     Class II INDICATIONS:  dysphagia and heartburn. MEDICATIONS: Propofol 200 mg IV and Monitored anesthesia care TOPICAL ANESTHETIC: none  DESCRIPTION OF PROCEDURE: After the risks benefits and alternatives of the procedure were thoroughly explained, informed consent was obtained.  The LB [id number] endoscope was introduced through the mouth and advanced to the second portion of the duodenum , Without limitations.  The instrument was slowly withdrawn as the mucosa was fully examined.    1) Stricture appearance at GE junction but no effect w/ balloon dilation18,19,20 mm 2) Punctate red spots antrum - ? gastritis - biopsied 3) Otherwise normal EGD.  Retroflexed views revealed no abnormalities.     The scope was then withdrawn from the patient and the procedure completed.  COMPLICATIONS: There were no immediate complications.  ENDOSCOPIC IMPRESSION: 1) Stricture appearance at GE junction but no effect w/ balloon dilation18,19,20 mm so wide open and not a cause for dysphagia 2) Punctate red spots antrum - ? gastritis - biopsied 3) Otherwise normal EGD  - no signs of  eosinophilic esophagitis  RECOMMENDATIONS: 1.  Await pathology results 2.  Next step in evaluation would be esophageal manometry, possibly GES given regurgitation 3.  She is better on famotidine 20 mg bid and seems to be intolerant of PPI. 4. Consider allergy evaluation through PCP- she c/o numerous suspected environmental allergies 5. Does not need post-dilation diet   eSigned:  Gatha Mayer, MD, [facility] 06/23/2015 10:23 AM    [id number] Erin Hail, MD and [employer]

## 2015-06-23 NOTE — Patient Instructions (Addendum)
YOU HAD AN ENDOSCOPIC PROCEDURE TODAY AT THE [facility]:   Refer to the procedure report that was given to you for any specific questions about what was found during the examination.  If the procedure report does not answer your questions, please call your gastroenterologist to clarify.  If you requested that your care partner not be given the details of your procedure findings, then the procedure report has been included in a sealed envelope for you to review at your convenience later.  YOU SHOULD EXPECT: Some feelings of bloating in the abdomen. Passage of more gas than usual.  Walking can help get rid of the air that was put into your GI tract during the procedure and reduce the bloating. If you had a lower endoscopy (such as a colonoscopy or flexible sigmoidoscopy) you may notice spotting of blood in your stool or on the toilet paper. If you underwent a bowel prep for your procedure, you may not have a normal bowel movement for a few days.  Please Note:  You might notice some irritation and congestion in your nose or some drainage.  This is from the oxygen used during your procedure.  There is no need for concern and it should clear up in a day or so.  SYMPTOMS TO REPORT IMMEDIATELY:    Following upper endoscopy (EGD)  Vomiting of blood or coffee ground material  New chest pain or pain under the shoulder blades  Painful or persistently difficult swallowing  New shortness of breath  Fever of 100F or higher  Black, tarry-looking stools  For urgent or emergent issues, a gastroenterologist can be reached at any hour by calling [phone].   DIET: Your first meal following the procedure should be a small meal and then it is ok to progress to your normal diet. Heavy or fried foods are harder to digest and may make you feel nauseous or bloated.  Likewise, meals heavy in dairy and vegetables can increase bloating.  Drink plenty of fluids but you should avoid alcoholic beverages  for 24 hours.  ACTIVITY:  You should plan to take it easy for the rest of today and you should NOT DRIVE or use heavy machinery until tomorrow (because of the sedation medicines used during the test).    FOLLOW UP: Our staff will call the number listed on your records the next business day following your procedure to check on you and address any questions or concerns that you may have regarding the information given to you following your procedure. If we do not reach you, we will leave a message.  However, if you are feeling well and you are not experiencing any problems, there is no need to return our call.  We will assume that you have returned to your regular daily activities without incident.  If any biopsies were taken you will be contacted by phone or by letter within the next 1-3 weeks.  Please call us at [phone] if you have not heard about the biopsies in 3 weeks.    SIGNATURES/CONFIDENTIALITY: You and/or your care partner have signed paperwork which will be entered into your electronic medical record.  These signatures attest to the fact that that the information above on your After Visit Summary has been reviewed and is understood.  Full responsibility of the confidentiality of this discharge information lies with you and/or your care-partner. 

## 2015-06-23 NOTE — Progress Notes (Signed)
Called to room to assist during endoscopic procedure.  Patient ID and intended procedure confirmed with present staff. Received instructions for my participation in the procedure from the performing physician.  

## 2015-06-23 NOTE — Progress Notes (Signed)
Report to PACU, RN, vss, BBS= Clear.  

## 2015-06-24 ENCOUNTER — Telehealth: Payer: Self-pay

## 2015-06-24 NOTE — Telephone Encounter (Signed)
Attempt post procedure courtesy call. No answer, left voice mail.

## 2015-07-03 NOTE — Progress Notes (Signed)
Quick Note:  Biopsies show some chronic gastritis - no infection and no cancer If she is inclined can go back to PCP and ask about allergy testing to see if that would help sort out some of her sxs and causes  No further GI w/u at this time and f/u prn  Call form office please - [facility] no recall and no letter ______

## 2016-09-19 ENCOUNTER — Other Ambulatory Visit: Payer: Self-pay | Admitting: Obstetrics and Gynecology

## 2016-09-19 DIAGNOSIS — N644 Mastodynia: Secondary | ICD-10-CM

## 2016-09-21 ENCOUNTER — Ambulatory Visit
Admission: RE | Admit: 2016-09-21 | Discharge: 2016-09-21 | Disposition: A | Discharge status: Home / Self Care | Payer: Medicare Other | Source: Ambulatory Visit | Attending: Obstetrics and Gynecology | Admitting: Obstetrics and Gynecology

## 2016-09-21 DIAGNOSIS — N644 Mastodynia: Secondary | ICD-10-CM

## 2016-12-28 ENCOUNTER — Encounter (HOSPITAL_COMMUNITY): Payer: Self-pay | Admitting: Oncology

## 2016-12-28 ENCOUNTER — Emergency Department (HOSPITAL_COMMUNITY): Payer: Medicare Other

## 2016-12-28 ENCOUNTER — Emergency Department (HOSPITAL_COMMUNITY)
Admission: EM | Admit: 2016-12-28 | Discharge: 2016-12-28 | Disposition: A | Discharge status: Home / Self Care | Payer: Medicare Other | Attending: Emergency Medicine | Admitting: Emergency Medicine

## 2016-12-28 DIAGNOSIS — Z87891 Personal history of nicotine dependence: Secondary | ICD-10-CM | POA: Insufficient documentation

## 2016-12-28 DIAGNOSIS — Z79899 Other long term (current) drug therapy: Secondary | ICD-10-CM | POA: Insufficient documentation

## 2016-12-28 DIAGNOSIS — Z91040 Latex allergy status: Secondary | ICD-10-CM | POA: Diagnosis not present

## 2016-12-28 DIAGNOSIS — R569 Unspecified convulsions: Secondary | ICD-10-CM | POA: Diagnosis not present

## 2016-12-28 LAB — I-STAT BETA HCG BLOOD, ED (MC, WL, AP ONLY)

## 2016-12-28 LAB — CBC WITH DIFFERENTIAL/PLATELET
BASOS PCT: 0 %
Basophils Absolute: 0 10*3/uL (ref 0.0–0.1)
EOS ABS: 0.1 10*3/uL (ref 0.0–0.7)
EOS PCT: 2 %
HCT: 36.4 % (ref 36.0–46.0)
HEMOGLOBIN: 12.3 g/dL (ref 12.0–15.0)
Lymphocytes Relative: 45 %
Lymphs Abs: 3 10*3/uL (ref 0.7–4.0)
MCH: 27.8 pg (ref 26.0–34.0)
MCHC: 33.8 g/dL (ref 30.0–36.0)
MCV: 82.2 fL (ref 78.0–100.0)
Monocytes Absolute: 0.5 10*3/uL (ref 0.1–1.0)
Monocytes Relative: 7 %
NEUTROS PCT: 46 %
Neutro Abs: 3 10*3/uL (ref 1.7–7.7)
PLATELETS: 370 10*3/uL (ref 150–400)
RBC: 4.43 MIL/uL (ref 3.87–5.11)
RDW: 13.4 % (ref 11.5–15.5)
WBC: 6.6 10*3/uL (ref 4.0–10.5)

## 2016-12-28 LAB — COMPREHENSIVE METABOLIC PANEL
ALK PHOS: 34 U/L — AB (ref 38–126)
ALT: 9 U/L — AB (ref 14–54)
AST: 17 U/L (ref 15–41)
Albumin: 4 g/dL (ref 3.5–5.0)
Anion gap: 10 (ref 5–15)
BUN: 7 mg/dL (ref 6–20)
CALCIUM: 9.5 mg/dL (ref 8.9–10.3)
CO2: 22 mmol/L (ref 22–32)
CREATININE: 0.9 mg/dL (ref 0.44–1.00)
Chloride: 103 mmol/L (ref 101–111)
Glucose, Bld: 110 mg/dL — ABNORMAL HIGH (ref 65–99)
Potassium: 3.4 mmol/L — ABNORMAL LOW (ref 3.5–5.1)
SODIUM: 135 mmol/L (ref 135–145)
Total Bilirubin: 0.4 mg/dL (ref 0.3–1.2)
Total Protein: 7.5 g/dL (ref 6.5–8.1)

## 2016-12-28 LAB — MAGNESIUM: Magnesium: 1.9 mg/dL (ref 1.7–2.4)

## 2016-12-28 MED ORDER — SODIUM CHLORIDE 0.9 % IV SOLN
1000.0000 mg | Freq: Once | INTRAVENOUS | Status: AC
  Administered 2016-12-28: 1000 mg via INTRAVENOUS
  Filled 2016-12-28: qty 10

## 2016-12-28 MED ORDER — LORAZEPAM 1 MG PO TABS
1.0000 mg | ORAL_TABLET | Freq: Once | ORAL | Status: AC
  Administered 2016-12-28: 1 mg via ORAL
  Filled 2016-12-28: qty 1

## 2016-12-28 MED ORDER — LEVETIRACETAM 500 MG PO TABS: 500.0000 mg | ORAL_TABLET | Freq: Two times a day (BID) | ORAL | 0 refills | Status: DC

## 2016-12-28 NOTE — ED Provider Notes (Signed)
[facility] DEPT Provider Note   CSN: [id number] Arrival date & time: 12/28/16  6213     History   Chief Complaint Chief Complaint  Patient presents with  . Seizures    HPI Erin Ray is a 36 y.o. female.  The history is provided by the patient. No language interpreter was used.  Seizures     Erin Ray is a 36 y.o. female who presents to the Emergency Department complaining of seizure.  She presents for evaluation following a seizure. 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 She was temporarily on seizure medications for 3 years but these have been discontinued. She has recently recovered from a URI with cough and congestion. Today she was in the kitchen when she states she passed out. Her mom states that she heard a thump and was found on the floor with generalized low volume shaking activity. The shaking lasted about a second followed by approximately 10 minutes of post ictal period with sonorous respirations. When she aroused she was slow to recover. Currently states that she is in her routine state of health with mild lightheadedness. She denies any injuries. No fevers. She did have some nausea and abdominal discomfort earlier today, now gone.   Past Medical History:  Diagnosis Date  . Anemia    prior to hysterectomy in 2014  . Fibroids   . GERD (gastroesophageal reflux disease)    hx of esophogeal dilatation 2011  . Obesity   . Seizures ([place])    due to [facility] 2011  . West Nile fever meningitis 2011    Patient Active Problem List   Diagnosis Date Noted  . Fibroids 10/10/2012    Past Surgical History:  Procedure Laterality Date  . ABDOMINAL HYSTERECTOMY    . BILATERAL SALPINGECTOMY Bilateral 10/09/2012   Procedure: BILATERAL SALPINGECTOMY;  Surgeon: Emily Filbert, MD;  Location: [facility];  Service: Gynecology;  Laterality: Bilateral;  . BRAIN SURGERY  2011   hist of west nile , "brain swelling"  drained  fluid  . BRAIN SURGERY     past [place] be followed here by [facility]  . CYSTOSCOPY N/A 10/09/2012   Procedure: CYSTOSCOPY;  Surgeon: Emily Filbert, MD;  Location: [facility];  Service: Gynecology;  Laterality: N/A;  . DILATION AND CURETTAGE OF UTERUS    . ESOPHAGEAL DILATION  2011  . MULTIPLE TOOTH EXTRACTIONS    . ROBOTIC ASSISTED TOTAL HYSTERECTOMY N/A 10/09/2012   Procedure: ROBOTIC ASSISTED TOTAL HYSTERECTOMY;  Surgeon: Emily Filbert, MD;  Location: [facility];  Service: Gynecology;  Laterality: N/A;    OB History    Gravida Para Term Preterm AB Living   0 0 0 0 0 0   SAB TAB Ectopic Multiple Live Births   0 0 0 0         Home Medications    Prior to Admission medications   Medication Sig Start Date End Date Taking? Authorizing Provider  acetaminophen (TYLENOL) 500 MG tablet Take 500-1,000 mg by mouth every 6 (six) hours as needed (for pain or headaches).   Yes [provider]  calcium carbonate (TUMS - DOSED IN MG ELEMENTAL CALCIUM) 500 MG chewable tablet Chew 1 tablet by mouth 2 (two) times daily as needed for heartburn.    Yes [provider]  cetirizine (ZYRTEC) 10 MG tablet Take 10 mg by mouth daily.   Yes [provider]  diphenhydrAMINE (BENADRYL) 12.5 MG/5ML liquid Take 25 mg by mouth 4 (four) times daily as needed for allergies (or cold symptoms).    Yes [provider]  EPINEPHrine (EPIPEN 2-PAK) 0.3 mg/0.3 mL IJ SOAJ injection Inject 0.3 mLs into the muscle once as needed for anaphylaxis.   Yes [provider]  famotidine (PEPCID) 20 MG tablet Take 1 tablet (20 mg total) by mouth 2 (two) times daily. 06/06/15  Yes Hvozdovic, Lori P, PA-C  fluticasone (FLONASE) 50 MCG/ACT nasal spray Place 1 spray into both nostrils 2 (two) times daily as needed for allergies.    Yes [provider]  guaifenesin (ROBITUSSIN) 100 MG/5ML syrup Take 200 mg by mouth 3 (three) times daily as needed for cough.   Yes  [provider]  norethindrone-ethinyl estradiol (MICROGESTIN,JUNEL,LOESTRIN) 1-20 MG-MCG tablet Take 1 tablet by mouth daily. 10/30/16  Yes [provider]  pseudoephedrine (SUDAFED) 120 MG 12 hr tablet Take 120 mg by mouth 2 (two) times daily as needed for congestion.    Yes [provider]  ibuprofen (ADVIL,MOTRIN) 800 MG tablet Take 1 tablet (800 mg total) by mouth every 8 (eight) hours as needed (mild pain). Patient not taking: Reported on 12/28/2016 10/10/12   Emily Filbert, MD  levETIRAcetam (KEPPRA) 500 MG tablet Take 1 tablet (500 mg total) by mouth 2 (two) times daily. 12/28/16   Quintella Reichert, MD    Family History Family History  Problem Relation Age of Onset  . Hypertension Mother   . Breast cancer Paternal Grandmother   . Colon cancer Paternal Grandmother   . Lung cancer Maternal Grandmother   . Lung cancer Paternal Grandfather   . Breast cancer Paternal Aunt     Social History Social History  Substance Use Topics  . Smoking status: Former Smoker    Types: Cigarettes    Quit date: 07/02/2004  . Smokeless tobacco: Never Used  . Alcohol use No     Comment: stopped 3 yrs ago     Allergies   Amoxicillin; Cefuroxime axetil; Penicillins; Allopurinol [acetaminophen]; Latex; and Tape   Review of Systems Review of Systems  Neurological: Positive for seizures.  All other systems reviewed and are negative.    Physical Exam Updated Vital Signs BP 118/75 (BP Location: Right Arm)   Pulse 75   Temp 97.5 F (36.4 C) (Oral)   Resp 16   Ht 5\' 2"  (1.575 m)   Wt 63 kg (139 lb)   LMP 08/13/2012   SpO2 100%   BMI 25.42 kg/m   Physical Exam  Constitutional: She is oriented to person, place, and time. She appears well-developed and well-nourished.  HENT:  Head: Normocephalic and atraumatic.  Eyes: EOM are normal. Pupils are equal, round, and reactive to light.  Neck: Neck supple.  Cardiovascular: Normal rate and regular rhythm.   No murmur  heard. Pulmonary/Chest: Effort normal and breath sounds normal. No respiratory distress.  Abdominal: Soft. There is no tenderness. There is no rebound and no guarding.  Musculoskeletal: She exhibits no edema or tenderness.  Neurological: She is alert and oriented to person, place, and time. No cranial nerve deficit.  5 out of 5 strength in all 4 extremities with sensation to light touch intact in all 4 extremities. Visual fields intact. No pronator drift.  Skin: Skin is warm and dry.  Psychiatric: She has a normal mood and affect. Her behavior is normal.  Nursing note and vitals reviewed.    ED Treatments / Results  Labs (  all labs ordered are listed, but only abnormal results are displayed) Labs Reviewed  COMPREHENSIVE METABOLIC PANEL - Abnormal; Notable for the following:       Result Value   Potassium 3.4 (*)    Glucose, Bld 110 (*)    ALT 9 (*)    Alkaline Phosphatase 34 (*)    All other components within normal limits  CBC WITH DIFFERENTIAL/PLATELET  MAGNESIUM  I-STAT BETA HCG BLOOD, ED ([facility], [facility], [facility] ONLY)  CBG MONITORING, ED    EKG  EKG Interpretation  Date/Time:  Friday December 28 2016 19:29:15 EDT Ventricular Rate:  98 PR Interval:    QRS Duration: 84 QT Interval:  323 QTC Calculation: 413 R Axis:   105 Text Interpretation:  Right and left arm electrode reversal, interpretation assumes no reversal Sinus rhythm Borderline right axis deviation Abnormal T, consider ischemia, lateral leads Confirmed by Hazle Coca [phone]) on 12/28/2016 7:58:18 PM       Radiology Ct Head Wo Contrast  Result Date: 12/28/2016 CLINICAL DATA:  36 year old female with seizure. History of West Nile virus, encephalitis with prior brain procedure. EXAM: CT HEAD WITHOUT CONTRAST TECHNIQUE: Contiguous axial images were obtained from the base of the skull through the vertex without intravenous contrast. COMPARISON:  None. FINDINGS: Brain: No evidence of acute infarction, hemorrhage, hydrocephalus,  extra-axial collection or mass lesion/mass effect. Right frontal encephalomalacia noted. Vascular: No hyperdense vessel or unexpected calcification. Skull: No acute abnormality.  Right frontal burr hole noted. Sinuses/Orbits: No acute finding. Other: None. IMPRESSION: No evidence of acute abnormality. Right frontal encephalomalacia. Electronically Signed   By: Margarette Canada M.D.   On: 12/28/2016 21:41    Procedures Procedures (including critical care time)  Medications Ordered in ED Medications  levETIRAcetam (KEPPRA) 1,000 mg in sodium chloride 0.9 % 100 mL IVPB (0 mg Intravenous Stopped 12/28/16 2240)  LORazepam (ATIVAN) tablet 1 mg (1 mg Oral Given 12/28/16 2209)     Initial Impression / Assessment and Plan / ED Course  I have reviewed the triage vital signs and the nursing notes.  Pertinent labs & imaging results that were available during my care of the patient were reviewed by me and considered in my medical decision making (see chart for details).     Patient with history of seizures, no longer on seizure medications in here for evaluation following a seizure. She is currently at her neurologic baseline in the emergency department with no focal deficits. Plan to restart her Keppra with close neurology follow-up. Seizure precautions discussed. Presentation is not consistent with significant arrhythmia, PE, meningitis, CVA.  Final Clinical Impressions(s) / ED Diagnoses   Final diagnoses:  Seizure [facility])    New Prescriptions Discharge Medication List as of 12/28/2016 10:11 PM    START taking these medications   Details  levETIRAcetam (KEPPRA) 500 MG tablet Take 1 tablet (500 mg total) by mouth 2 (two) times daily., Starting Fri 12/28/2016, Print         Quintella Reichert, MD 12/28/16 2306

## 2016-12-28 NOTE — ED Triage Notes (Addendum)
Pt bib from home by [facility] d/t seizure.  Per EMS pt has hx of 2 seizures in 2012 that was attributed to [facility] virus in which pt reported that she was hospitalized for 2 months.  Per EMS pt was feeling fine all day.  Pt's mom heard pt fall from other room, went to check and pt was having seizure like activity.  Per EMS pt was post ictal upon their arrival.  Tachycardic, confused and sluggish.  Per EMS this resolved quickly.  Pt reported nausea to EMS and was given 4 mg ODT Zofran en route.  Reported reduction in nausea to EMS.  Pt A&O x 4 upon arrival to ED.

## 2019-08-05 ENCOUNTER — Other Ambulatory Visit: Payer: Self-pay

## 2019-08-05 ENCOUNTER — Emergency Department (HOSPITAL_COMMUNITY)
Admission: EM | Admit: 2019-08-05 | Discharge: 2019-08-05 | Disposition: A | Discharge status: Home / Self Care | Payer: Medicare Other | Attending: Emergency Medicine | Admitting: Emergency Medicine

## 2019-08-05 DIAGNOSIS — Z87891 Personal history of nicotine dependence: Secondary | ICD-10-CM | POA: Diagnosis not present

## 2019-08-05 DIAGNOSIS — L509 Urticaria, unspecified: Secondary | ICD-10-CM | POA: Insufficient documentation

## 2019-08-05 DIAGNOSIS — Z91040 Latex allergy status: Secondary | ICD-10-CM | POA: Diagnosis not present

## 2019-08-05 DIAGNOSIS — T492X5A Adverse effect of local astringents and local detergents, initial encounter: Secondary | ICD-10-CM | POA: Insufficient documentation

## 2019-08-05 DIAGNOSIS — R21 Rash and other nonspecific skin eruption: Secondary | ICD-10-CM

## 2019-08-05 DIAGNOSIS — Z79899 Other long term (current) drug therapy: Secondary | ICD-10-CM | POA: Insufficient documentation

## 2019-08-05 DIAGNOSIS — T7840XA Allergy, unspecified, initial encounter: Secondary | ICD-10-CM

## 2019-08-05 MED ORDER — FAMOTIDINE IN NACL 20-0.9 MG/50ML-% IV SOLN
20.0000 mg | Freq: Once | INTRAVENOUS | Status: AC
Start: 2019-08-05 — End: 2019-08-05
  Administered 2019-08-05: 20 mg via INTRAVENOUS
  Filled 2019-08-05: qty 50

## 2019-08-05 MED ORDER — SODIUM CHLORIDE 0.9 % IV BOLUS
1000.0000 mL | Freq: Once | INTRAVENOUS | Status: AC
  Administered 2019-08-05: 1000 mL via INTRAVENOUS

## 2019-08-05 MED ORDER — EPINEPHRINE 0.3 MG/0.3ML IJ SOAJ: 0.3000 mg | INTRAMUSCULAR | 0 refills | Status: DC | PRN

## 2019-08-05 MED ORDER — DIPHENHYDRAMINE HCL 50 MG/ML IJ SOLN
25.0000 mg | Freq: Once | INTRAMUSCULAR | Status: AC
Start: 2019-08-05 — End: 2019-08-05
  Administered 2019-08-05: 25 mg via INTRAVENOUS
  Filled 2019-08-05: qty 1

## 2019-08-05 NOTE — ED Provider Notes (Signed)
[facility] DEPT Provider Note   CSN: [id number] Arrival date & time: 08/05/19  1125     History Chief Complaint  Patient presents with  . Rash    Erin Ray is a 39 y.o. female.  The history is provided by the patient and medical records. No language interpreter was used.  Allergic Reaction Presenting symptoms: itching, rash and swelling (face)   Presenting symptoms: no difficulty breathing, no difficulty swallowing and no wheezing   Severity:  Mild Prior allergic episodes:  No prior episodes Context: new detergents/soaps   Context: not food allergies and not medications   Relieved by:  Steroids Worsened by:  Nothing Ineffective treatments:  None tried      Past Medical History:  Diagnosis Date  . Anemia    prior to hysterectomy in 2014  . Fibroids   . GERD (gastroesophageal reflux disease)    hx of esophogeal dilatation 2011  . Obesity   . Seizures ([place])    due to [facility] 2011  . West Nile fever meningitis 2011    Patient Active Problem List   Diagnosis Date Noted  . Fibroids 10/10/2012    Past Surgical History:  Procedure Laterality Date  . ABDOMINAL HYSTERECTOMY    . BILATERAL SALPINGECTOMY Bilateral 10/09/2012   Procedure: BILATERAL SALPINGECTOMY;  Surgeon: Emily Filbert, MD;  Location: [facility];  Service: Gynecology;  Laterality: Bilateral;  . BRAIN SURGERY  2011   hist of west nile , "brain swelling"  drained fluid  . BRAIN SURGERY     [place] [facility]  . CYSTOSCOPY N/A 10/09/2012   Procedure: CYSTOSCOPY;  Surgeon: Emily Filbert, MD;  Location: [facility];  Service: Gynecology;  Laterality: N/A;  . DILATION AND CURETTAGE OF UTERUS    . ESOPHAGEAL DILATION  2011  . MULTIPLE TOOTH EXTRACTIONS    . ROBOTIC ASSISTED TOTAL HYSTERECTOMY N/A 10/09/2012   Procedure: ROBOTIC ASSISTED TOTAL HYSTERECTOMY;  Surgeon: Emily Filbert, MD;  Location: [facility];  Service: Gynecology;   Laterality: N/A;     OB History    Gravida  0   Para  0   Term  0   Preterm  0   AB  0   Living  0     SAB  0   TAB  0   Ectopic  0   Multiple  0   Live Births              Family History  Problem Relation Age of Onset  . Hypertension Mother   . Breast cancer Paternal Grandmother   . Colon cancer Paternal Grandmother   . Lung cancer Maternal Grandmother   . Lung cancer Paternal Grandfather   . Breast cancer Paternal Aunt     Social History   Tobacco Use  . Smoking status: Former Smoker    Types: Cigarettes    Quit date: 07/02/2004    Years since quitting: 15.1  . Smokeless tobacco: Never Used  Substance Use Topics  . Alcohol use: No    Alcohol/week: 0.0 standard drinks    Comment: stopped 3 yrs ago  . Drug use: No    Comment: Hx:  Marijuana use.  Stopped 9 yrs. ago.    Home Medications Prior to Admission medications   Medication Sig Start Date End Date Taking? Authorizing Provider  acetaminophen (TYLENOL) 500 MG tablet Take 500-1,000 mg by mouth every 6 (six) hours as  needed (for pain or headaches).    [provider]  calcium carbonate (TUMS - DOSED IN MG ELEMENTAL CALCIUM) 500 MG chewable tablet Chew 1 tablet by mouth 2 (two) times daily as needed for heartburn.     [provider]  cetirizine (ZYRTEC) 10 MG tablet Take 10 mg by mouth daily.    [provider]  diphenhydrAMINE (BENADRYL) 12.5 MG/5ML liquid Take 25 mg by mouth 4 (four) times daily as needed for allergies (or cold symptoms).     [provider]  EPINEPHrine (EPIPEN 2-PAK) 0.3 mg/0.3 mL IJ SOAJ injection Inject 0.3 mLs into the muscle once as needed for anaphylaxis.    [provider]  famotidine (PEPCID) 20 MG tablet Take 1 tablet (20 mg total) by mouth 2 (two) times daily. 06/06/15   Hvozdovic, Lori P, PA-C  fluticasone (FLONASE) 50 MCG/ACT nasal spray Place 1 spray into both nostrils 2 (two) times daily as needed for allergies.      [provider]  guaifenesin (ROBITUSSIN) 100 MG/5ML syrup Take 200 mg by mouth 3 (three) times daily as needed for cough.    [provider]  ibuprofen (ADVIL,MOTRIN) 800 MG tablet Take 1 tablet (800 mg total) by mouth every 8 (eight) hours as needed (mild pain). Patient not taking: Reported on 12/28/2016 10/10/12   Emily Filbert, MD  levETIRAcetam (KEPPRA) 500 MG tablet Take 1 tablet (500 mg total) by mouth 2 (two) times daily. 12/28/16   Quintella Reichert, MD  norethindrone-ethinyl estradiol (MICROGESTIN,JUNEL,LOESTRIN) 1-20 MG-MCG tablet Take 1 tablet by mouth daily. 10/30/16   [provider]  pseudoephedrine (SUDAFED) 120 MG 12 hr tablet Take 120 mg by mouth 2 (two) times daily as needed for congestion.     [provider]    Allergies    Amoxicillin, Cefuroxime axetil, Penicillins, Allopurinol [acetaminophen], Latex, and Tape  Review of Systems   Review of Systems  Constitutional: Negative for chills, diaphoresis, fatigue and fever.  HENT: Positive for facial swelling. Negative for congestion and trouble swallowing.   Respiratory: Negative for cough, chest tightness, shortness of breath and wheezing.   Cardiovascular: Negative for chest pain.  Gastrointestinal: Negative for abdominal pain, constipation, diarrhea, nausea and vomiting.  Genitourinary: Negative for dysuria.  Musculoskeletal: Negative for back pain.  Skin: Positive for itching and rash. Negative for wound.  Neurological: Negative for headaches.  Psychiatric/Behavioral: Negative for agitation.  All other systems reviewed and are negative.   Physical Exam Updated Vital Signs BP (!) 153/96   Pulse (!) 110   Temp 98.3 F (36.8 C)   Resp 18   LMP 08/13/2012   SpO2 99%   Physical Exam Vitals and nursing note reviewed.  Constitutional:      General: She is not in acute distress.    Appearance: She is well-developed. She is not ill-appearing, toxic-appearing or diaphoretic.  HENT:      Head: Normocephalic and atraumatic.     Right Ear: External ear normal.     Left Ear: External ear normal.     Nose: Nose normal. No congestion or rhinorrhea.     Mouth/Throat:     Mouth: Mucous membranes are moist.     Pharynx: No oropharyngeal exudate or posterior oropharyngeal erythema.  Eyes:     Conjunctiva/sclera: Conjunctivae normal.     Pupils: Pupils are equal, round, and reactive to light.  Cardiovascular:     Rate and Rhythm: Normal rate.     Pulses: Normal pulses.  Heart sounds: No murmur.  Pulmonary:     Effort: Pulmonary effort is normal. No respiratory distress.     Breath sounds: No stridor. No wheezing, rhonchi or rales.  Chest:     Chest wall: No tenderness.  Abdominal:     General: Abdomen is flat. There is no distension.     Tenderness: There is no abdominal tenderness. There is no right CVA tenderness, left CVA tenderness or rebound.  Musculoskeletal:        General: No tenderness.     Cervical back: Normal range of motion and neck supple. No tenderness.     Right lower leg: No edema.     Left lower leg: No edema.  Skin:    General: Skin is warm.     Capillary Refill: Capillary refill takes less than 2 seconds.     Coloration: Skin is not pale.     Findings: Erythema and rash present. Rash is urticarial.  Neurological:     General: No focal deficit present.     Mental Status: She is alert and oriented to person, place, and time.     Motor: No abnormal muscle tone.     Coordination: Coordination normal.     Deep Tendon Reflexes: Reflexes are normal and symmetric.  Psychiatric:        Mood and Affect: Mood normal.     ED Results / Procedures / Treatments   Labs (all labs ordered are listed, but only abnormal results are displayed) Labs Reviewed - No data to display  EKG None  Radiology No results found.  Procedures Procedures (including critical care time)  Medications Ordered in ED Medications  sodium chloride 0.9 % bolus 1,000 mL (0  mLs Intravenous Stopped 08/05/19 1417)  diphenhydrAMINE (BENADRYL) injection 25 mg (25 mg Intravenous Given 08/05/19 1242)  famotidine (PEPCID) IVPB 20 mg premix (0 mg Intravenous Stopped 08/05/19 1353)    ED Course  I have reviewed the triage vital signs and the nursing notes.  Pertinent labs & imaging results that were available during my care of the patient were reviewed by me and considered in my medical decision making (see chart for details).    MDM Rules/Calculators/A&P                      Erin Ray is a 39 y.o. female with a past medical history significant for GERD and seizures who presents with allergic reaction.  She reports that she tried a new soap several days ago and since then has had diffuse urticarial rash gradually worsening.  She reports she saw her PCP yesterday who ordered oral steroids for her.  She took her first dose this morning prior to come to the emergency department.  She reports has had pruritus and diffuse urticarial rash all over her body including her face.  She denies it being on her palms and soles.  She does report her lips have been swollen but denies any difficulty breathing or noisy breathing.  She denies any chest pain, shortness of breath, wheezing, or palpitations.  She denies nausea or vomiting.  She denies lightheadedness.  She reports that she did not take any antihistamines today but did take it yesterday before the PCP.  She reports she has had allergies to antibiotics in the past but never to do so.  She says that she has not been given a prescription for epinephrine.  On exam, patient has diffuse urticarial rash that blanches with palpation.  Patient has no focal neurologic deficits.  Lungs clear and chest nontender.  Abdomen nontender.  No stridor.  No posterior oropharyngeal edema or tongue swelling seen but he shows have some swelling of her lips.  She reports that is improved after the steroids.  Vital signs show some mild tachycardia on arrival  but otherwise she is resting comfortably.  Had a shared decision made conversation we agreed to give the patient fluids for her tachycardia as well as some IV antihistamines.  She already received the steroids today and they appear to be helping.  We watch the patient to make sure she continues to improve.  She has no airway compromise thus we do not feel she needs epinephrine at this time.  Patient is feeling better, anticipate discharge with prescription for EpiPen and instructions to continue oral antihistamines to go along with her steroids and have PCP follow-up.  Patient agrees with plan of care.  2:30 PM Patient reports allergic reaction is feeling much better.  Itching has improved and rash starting to fade.  No further swelling seen.  No stridor or wheezing developing.  Oropharyngeal exam still unremarkable distally.  Exam otherwise improving.  Patient will be instructed to take over-the-counter antihistamines with the steroid she is already prescribed.  She will be given prescription for EpiPen.  She agrees with plan of care and after 3 hours observation is not having any worsening symptoms.  Patient will follow up with PCP and understands return precautions.  Patient discharged in good condition.   Final Clinical Impression(s) / ED Diagnoses Final diagnoses:  Allergic reaction, initial encounter  Urticaria  Rash    Rx / DC Orders ED Discharge Orders         Ordered    EPINEPHrine 0.3 mg/0.3 mL IJ SOAJ injection  As needed     08/05/19 1434          Clinical Impression: 1. Allergic reaction, initial encounter   2. Urticaria   3. Rash     Disposition: Discharge  Condition: Good  I have discussed the results, Dx and Tx plan with the pt(& family if present). He/she/they expressed understanding and agree(s) with the plan. Discharge instructions discussed at great length. Strict return precautions discussed and pt &/or family have verbalized understanding of the  instructions. No further questions at time of discharge.    New Prescriptions   EPINEPHRINE 0.3 MG/0.3 ML IJ SOAJ INJECTION    Inject 0.3 mLs (0.3 mg total) into the muscle as needed for anaphylaxis.    Follow Up: Antonietta Jewel, MD [address] [place] [address] [place] DEPT [address] [id number] [place]       Charon Akamine, Gwenyth Allegra, MD 08/05/19 1434

## 2019-08-05 NOTE — ED Triage Notes (Signed)
Pt arrived ambulatory into ED from home CC body rash after using new soap on Friday. Pt reports "rash started on my arms and "now is all over my body".  Pts lips are swollen from baseline per pt but airway clear. Pt reports itchy red hives all over body (blanchable)". Pt was seen at PCP yesterday and prescribed oral steroids.   VSS Afebrile

## 2019-08-05 NOTE — Discharge Instructions (Signed)
Your history and exam today are consistent with an allergic reaction causing the hives and swelling.  As you do not have any evidence of respiratory compromise, we did not provide epinephrine but we gave you antihistamines to go along with the steroids you are already taking.  Please continue the steroids for the next few days and use antihistamines help with itching and continued symptoms.  You may use over-the-counter Pepcid and Benadryl to do this.  Please fill the prescription for EpiPen to use in case you begin to have airway compromise.  Please follow-up with your PCP.  If any symptoms change or worsen, please return to the nearest emergency department.  Please avoid the soap that you used which you feel caused this.

## 2019-08-05 NOTE — ED Notes (Signed)
Pt verbalizes understanding of DC instructions. Pt belongings returned and is ambulatory out of ED.  

## 2019-08-05 NOTE — ED Notes (Signed)
Pt dressed out in gown

## 2020-11-21 ENCOUNTER — Other Ambulatory Visit: Payer: Self-pay | Admitting: Anesthesiology

## 2020-11-21 ENCOUNTER — Other Ambulatory Visit: Payer: Self-pay | Admitting: Obstetrics and Gynecology

## 2020-11-21 DIAGNOSIS — Z1231 Encounter for screening mammogram for malignant neoplasm of breast: Secondary | ICD-10-CM

## 2021-01-17 ENCOUNTER — Ambulatory Visit
Admission: RE | Admit: 2021-01-17 | Discharge: 2021-01-17 | Disposition: A | Discharge status: Home / Self Care | Payer: Medicare Other | Source: Ambulatory Visit | Attending: Obstetrics and Gynecology | Admitting: Obstetrics and Gynecology

## 2021-01-17 ENCOUNTER — Other Ambulatory Visit: Payer: Self-pay

## 2021-01-17 DIAGNOSIS — Z1231 Encounter for screening mammogram for malignant neoplasm of breast: Secondary | ICD-10-CM

## 2021-03-07 ENCOUNTER — Telehealth: Payer: Self-pay

## 2021-03-07 ENCOUNTER — Emergency Department (HOSPITAL_COMMUNITY)
Admission: EM | Admit: 2021-03-07 | Discharge: 2021-03-07 | Disposition: A | Discharge status: Home / Self Care | Payer: Medicare Other | Attending: Emergency Medicine | Admitting: Emergency Medicine

## 2021-03-07 ENCOUNTER — Encounter (HOSPITAL_COMMUNITY): Payer: Self-pay | Admitting: *Deleted

## 2021-03-07 DIAGNOSIS — Z91040 Latex allergy status: Secondary | ICD-10-CM | POA: Diagnosis not present

## 2021-03-07 DIAGNOSIS — H9203 Otalgia, bilateral: Secondary | ICD-10-CM | POA: Diagnosis not present

## 2021-03-07 DIAGNOSIS — Z79899 Other long term (current) drug therapy: Secondary | ICD-10-CM | POA: Insufficient documentation

## 2021-03-07 DIAGNOSIS — H60503 Unspecified acute noninfective otitis externa, bilateral: Secondary | ICD-10-CM

## 2021-03-07 DIAGNOSIS — Z87891 Personal history of nicotine dependence: Secondary | ICD-10-CM | POA: Insufficient documentation

## 2021-03-07 MED ORDER — NEOMYCIN-POLYMYXIN-HC 3.5-10000-1 OT SOLN: 3.0000 [drp] | Freq: Three times a day (TID) | OTIC | 0 refills | Status: AC

## 2021-03-07 MED ORDER — CIPROFLOXACIN-DEXAMETHASONE 0.3-0.1 % OT SUSP: 4.0000 [drp] | Freq: Two times a day (BID) | OTIC | 0 refills | Status: DC

## 2021-03-07 MED ORDER — CIPROFLOXACIN-DEXAMETHASONE 0.3-0.1 % OT SUSP
4.0000 [drp] | Freq: Two times a day (BID) | OTIC | Status: DC
  Administered 2021-03-07: 4 [drp] via OTIC
  Filled 2021-03-07: qty 7.5

## 2021-03-07 NOTE — ED Provider Notes (Signed)
[facility] DEPT Provider Note   CSN: [id number] Arrival date & time: 03/07/21  1137     History Chief Complaint  Patient presents with   Ear Pain    Erin Ray is a 40 y.o. female presenting to ED with chief compliant of 5-6/10 bilateral ear pain x 2 weeks. Onset 2 weeks ago with associated greenish otorrhea which was noted by PCP during a visit 1 week ago related to this ear pain. She was prescribed ofloxacin 0.3 tid which she has been using for 1 week with no relief in sxs. She has not been swimming, using new earbuds, or been around any loud noises. She denies fevers, N/V, diarrhea, constipation, headaches, neck stiffness, myalgias, CP, and SOB. She is afebrile with no signs of systemic infection. She visited her PCP again this AM, PCP told her it does not appear to be improving, and would benefit from an ENT visit which could take several weeks. Pt left PCP office and decided to visit ED. No alleviating or precipitating factors. Pt denies any changes in hearing or difficulty hearing.   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[place] fever meningitis 2011    Patient Active Problem List   Diagnosis Date Noted   Fibroids 10/10/2012    Past Surgical History:  Procedure Laterality Date   ABDOMINAL HYSTERECTOMY     BILATERAL SALPINGECTOMY Bilateral 10/09/2012   Procedure: BILATERAL SALPINGECTOMY;  Surgeon: Emily Filbert, MD;  Location: [facility];  Service: Gynecology;  Laterality: Bilateral;   BRAIN SURGERY  2011   hist of [place] , "brain swelling"  drained fluid   BRAIN SURGERY     past [place], will be followed here by [facility]   CYSTOSCOPY N/A 10/09/2012   Procedure: CYSTOSCOPY;  Surgeon: Emily Filbert, MD;  Location: [facility];  Service: Gynecology;  Laterality: N/A;   DILATION AND CURETTAGE OF UTERUS     ESOPHAGEAL DILATION  2011   MULTIPLE TOOTH EXTRACTIONS     ROBOTIC ASSISTED TOTAL HYSTERECTOMY N/A 10/09/2012   Procedure: ROBOTIC ASSISTED TOTAL HYSTERECTOMY;  Surgeon: Emily Filbert, MD;  Location: [facility];  Service: Gynecology;  Laterality: N/A;     OB History     Gravida  0   Para  0   Term  0   Preterm  0   AB  0   Living  0      SAB  0   IAB  0   Ectopic  0   Multiple  0   Live Births              Family History  Problem Relation Age of Onset   Hypertension Mother    Breast cancer Paternal Grandmother    Colon cancer Paternal Grandmother    Lung cancer Maternal Grandmother    Lung cancer Paternal Grandfather    Breast cancer Paternal Aunt     Social History   Tobacco Use   Smoking status: Former    Types: Cigarettes    Quit date: 07/02/2004    Years since quitting: 16.6   Smokeless tobacco: Never  Substance Use Topics   Alcohol use:  No    Alcohol/week: 0.0 standard drinks    Comment: stopped 3 yrs ago   Drug use: No    Comment: Hx:  Marijuana use.  Stopped 9 yrs. ago.    Home Medications Prior to Admission medications   Medication Sig Start Date End Date Taking? Authorizing Provider  acetaminophen (TYLENOL) 500 MG tablet Take 500-1,000 mg by mouth every 6 (six) hours as needed (for pain or headaches).    [provider]  calcium carbonate (TUMS - DOSED IN MG ELEMENTAL CALCIUM) 500 MG chewable tablet Chew 1 tablet by mouth 2 (two) times daily as needed for heartburn.     [provider]  cetirizine (ZYRTEC) 10 MG tablet Take 10 mg by mouth daily.    [provider]  diphenhydrAMINE (BENADRYL) 12.5 MG/5ML liquid Take 25 mg by mouth 4 (four) times daily as needed for allergies (or cold symptoms).     [provider]  EPINEPHrine (EPIPEN 2-PAK) 0.3 mg/0.3 mL IJ SOAJ injection Inject 0.3 mLs into the muscle once as needed for  anaphylaxis.    [provider]  EPINEPHrine 0.3 mg/0.3 mL IJ SOAJ injection Inject 0.3 mLs (0.3 mg total) into the muscle as needed for anaphylaxis. 08/05/19   Tegeler, Gwenyth Allegra, MD  famotidine (PEPCID) 20 MG tablet Take 1 tablet (20 mg total) by mouth 2 (two) times daily. 06/06/15   Hvozdovic, Lori P, PA-C  fluticasone (FLONASE) 50 MCG/ACT nasal spray Place 1 spray into both nostrils 2 (two) times daily as needed for allergies.     [provider]  guaifenesin (ROBITUSSIN) 100 MG/5ML syrup Take 200 mg by mouth 3 (three) times daily as needed for cough.    [provider]  ibuprofen (ADVIL,MOTRIN) 800 MG tablet Take 1 tablet (800 mg total) by mouth every 8 (eight) hours as needed (mild pain). Patient not taking: Reported on 12/28/2016 10/10/12   Emily Filbert, MD  levETIRAcetam (KEPPRA) 500 MG tablet Take 1 tablet (500 mg total) by mouth 2 (two) times daily. 12/28/16   Quintella Reichert, MD  norethindrone-ethinyl estradiol (MICROGESTIN,JUNEL,LOESTRIN) 1-20 MG-MCG tablet Take 1 tablet by mouth daily. 10/30/16   [provider]  pseudoephedrine (SUDAFED) 120 MG 12 hr tablet Take 120 mg by mouth 2 (two) times daily as needed for congestion.     [provider]    Allergies    Amoxicillin, Cefuroxime axetil, Penicillins, Allopurinol [acetaminophen], Latex, and Tape  Review of Systems   Review of Systems  All other systems reviewed and are negative.   Physical Exam Updated Vital Signs BP (!) 165/95   Pulse 75   Temp 98.2 F (36.8 C) (Oral)   Resp 18   LMP 08/13/2012   SpO2 98%   Physical Exam Constitutional:      General: She is not in acute distress.    Appearance: Normal appearance. She is not ill-appearing.  HENT:     Head: Normocephalic and atraumatic.     Right Ear: External ear normal.     Left Ear: External ear normal.     Ears:     Comments: No appreciable erythema of bilateral ear canals. Left ear TM grossly intact. Right TM not  visualized with concerns of inflammation.      Nose: Nose normal. No congestion or rhinorrhea.     Mouth/Throat:     Mouth: Mucous membranes are moist.     Pharynx: No oropharyngeal exudate or posterior oropharyngeal erythema.  Eyes:     Extraocular  Movements: Extraocular movements intact.     Conjunctiva/sclera: Conjunctivae normal.     Pupils: Pupils are equal, round, and reactive to light.  Cardiovascular:     Rate and Rhythm: Normal rate and regular rhythm.  Pulmonary:     Effort: Pulmonary effort is normal.     Breath sounds: Normal breath sounds.  Abdominal:     General: Abdomen is flat.     Palpations: Abdomen is soft.  Musculoskeletal:     Cervical back: Neck supple. No rigidity.  Skin:    General: Skin is warm and dry.  Neurological:     General: No focal deficit present.     Mental Status: She is alert and oriented to person, place, and time. Mental status is at baseline.  Psychiatric:        Mood and Affect: Mood normal.        Behavior: Behavior normal.    ED Results / Procedures / Treatments   Labs (all labs ordered are listed, but only abnormal results are displayed) Labs Reviewed - No data to display  EKG None  Radiology No results found.  Procedures Procedures   Medications Ordered in ED Medications - No data to display  ED Course  I have reviewed the triage vital signs and the nursing notes.  Pertinent labs & imaging results that were available during my care of the patient were reviewed by me and considered in my medical decision making (see chart for details).    MDM Rules/Calculators/A&P                           Pt with bilateral ear pain x 2 weeks unsolved with 1 week of ofloxacin. Pt remains afebrile, no chills or N/V, and no concerns for systemic infection. Pt has hx of chronic sinusitis. On PE, right TM not visualized, with concerns for otitis externa. Suspect improvement with anti-inflammatory drops, Ciprodex prescribed. Pt to f/u as  outpatient with [facility].    Final Clinical Impression(s) / ED Diagnoses Final diagnoses:  None    Rx / DC Orders ED Discharge Orders     None     Ciprodex 0.3-0.1%    Lajean Manes, MD 03/07/21 1345    Carmin Muskrat, MD 03/08/21 1432

## 2021-03-07 NOTE — Discharge Instructions (Addendum)
Erin Ray, you were seen in the ED because of ear pain in both ears ongoing for several weeks, concerning for outer ear inflammation. You do not have concerning features, such as fever, low blood pressure, or high hear rates. You are prescribed an ear drop that will help with reducing inflammation of the ears. Please schedule an appointment with [facility] (number listed in this discharge packet) to schedule an appointment during their next available date.

## 2021-03-07 NOTE — Telephone Encounter (Signed)
PA was sent in through cover my meds for pt Ciprofloxacin-Dexamethasone 0.3-0.1% suspension awaiting approval or denial

## 2021-03-07 NOTE — ED Triage Notes (Signed)
Pt complains of bilateral ear pain x 2 weeks.

## 2021-03-08 NOTE — Telephone Encounter (Signed)
Decision  as follows :   Deniedon September 6 Your provider has decided to change your prescription to one of [employer]s covered alternatives. The originally requested drug is not on [employer] preferred drug list (formulary). Approval of the originally requested drug (called a formulary exception) requires that you first try some of the preferred drugs on your [facility] drug list (formulary), or tell us if they were/are not right for you. The drugs on [employer] drug list include: neomycin-polymyxin-hydrocort ear drops,suspension AND neomycin-polymyxin-hydrocort ear solution. Your providers statement didnt say the drugs on [employer] drug list: wont or havent worked, or that they would cause a bad drug reaction or incorrect usage. Your original request doesnt meet the coverage criteria for a formulary exception, but your provider has decided to change your prescription to one of [employer]s covered alternatives. Some preferred drugs may require an additional review and approval by [facility]. Additionally, some alternative drugs listed may be the same drugs with different strengths or forms. Under certain cases, [employer] may only require trial and failure of one strength or form of that drug. Please discuss this letter, and the new drug, with your provider. This determination was based on the [place] [employer] Non-Formulary Exceptions Coverage Policy.

## 2021-12-27 ENCOUNTER — Other Ambulatory Visit: Payer: Self-pay | Admitting: Obstetrics and Gynecology

## 2021-12-27 DIAGNOSIS — Z1231 Encounter for screening mammogram for malignant neoplasm of breast: Secondary | ICD-10-CM

## 2022-01-07 IMAGING — MG MM DIGITAL SCREENING BILAT W/ TOMO AND CAD
8 series · 8 of 24 positions shown · non-contrast
Comparison: Previous exam(s).

CLINICAL DATA: Screening.

EXAM:
DIGITAL SCREENING BILATERAL MAMMOGRAM WITH TOMOSYNTHESIS AND CAD
TECHNIQUE: Bilateral screening digital craniocaudal and mediolateral oblique
mammograms were obtained. Bilateral screening digital breast
tomosynthesis was performed. The images were evaluated with
computer-aided detection.

[L MLO synth-2D]
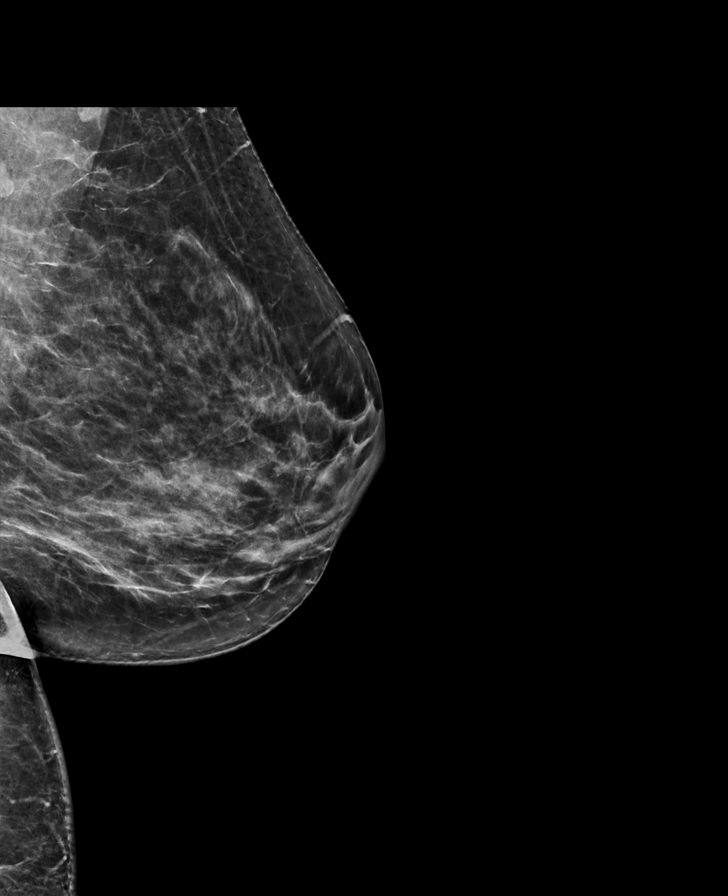

[R CC synth-2D]
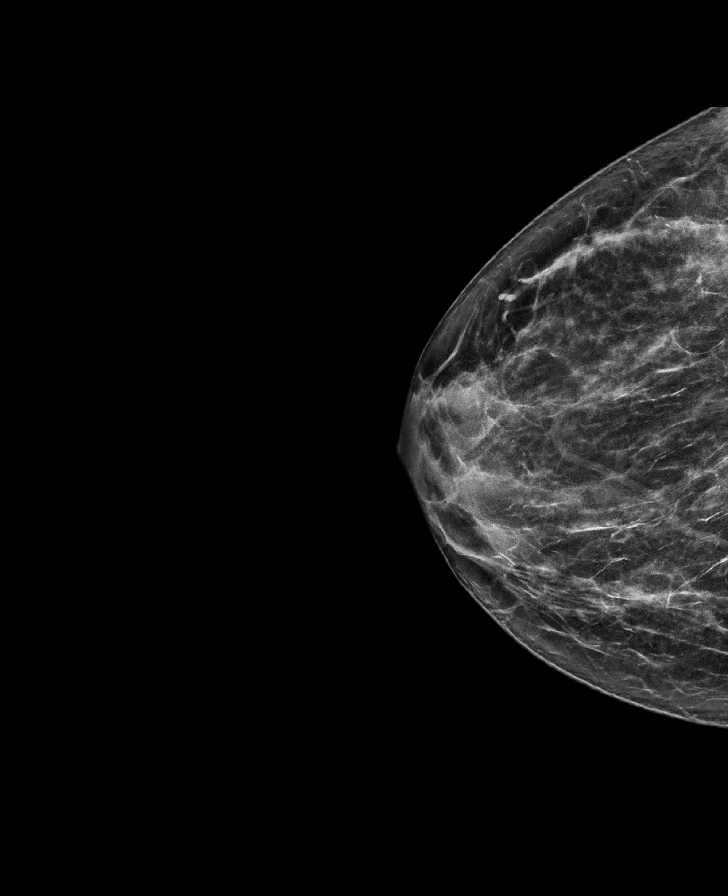

[R MLO synth-2D]
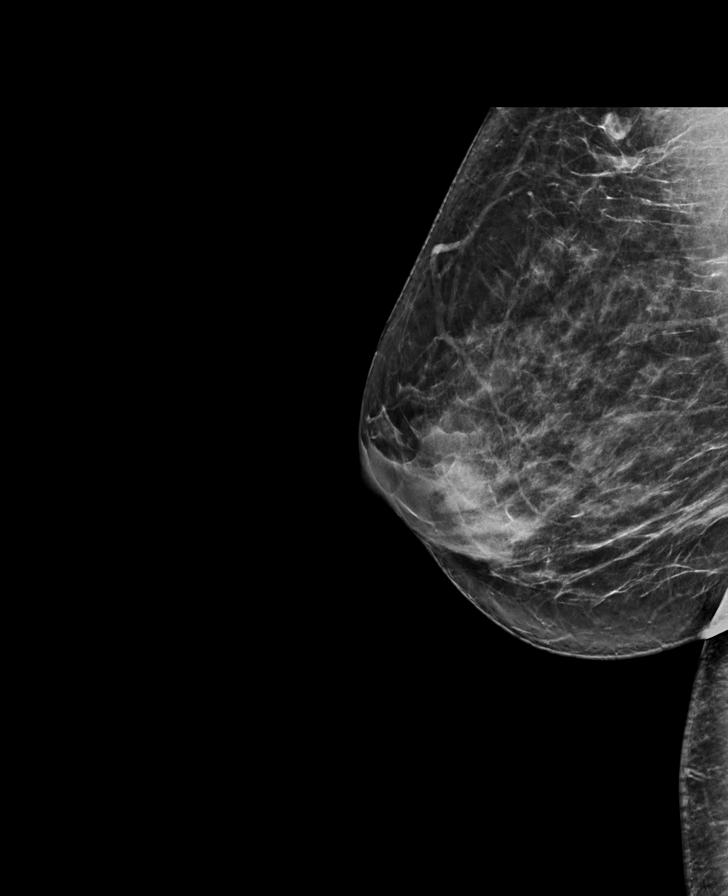

[L CC synth-2D]
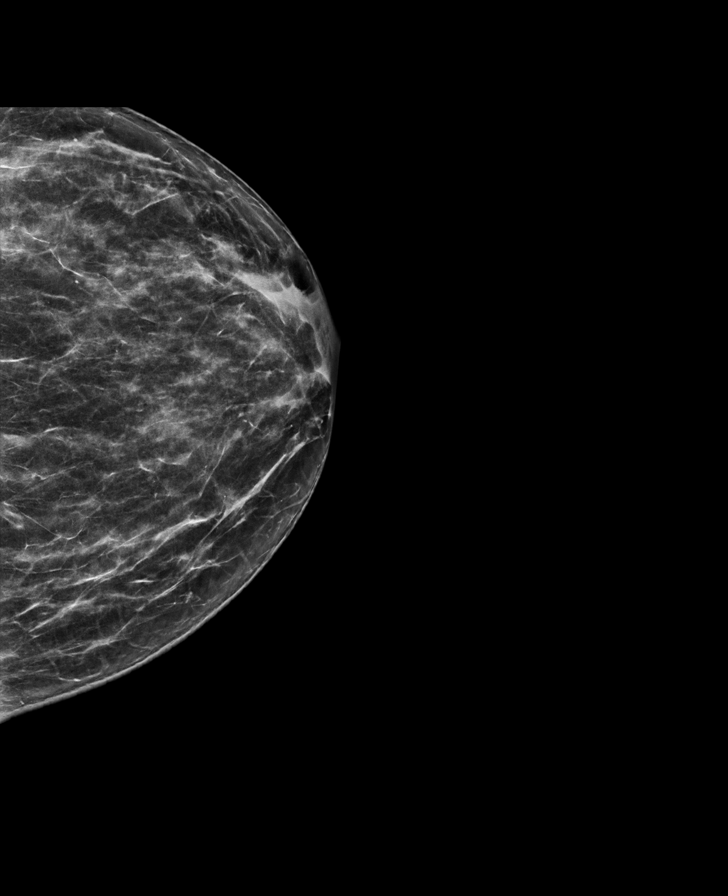

[R CC tomo · tomo slice 35/69.0]
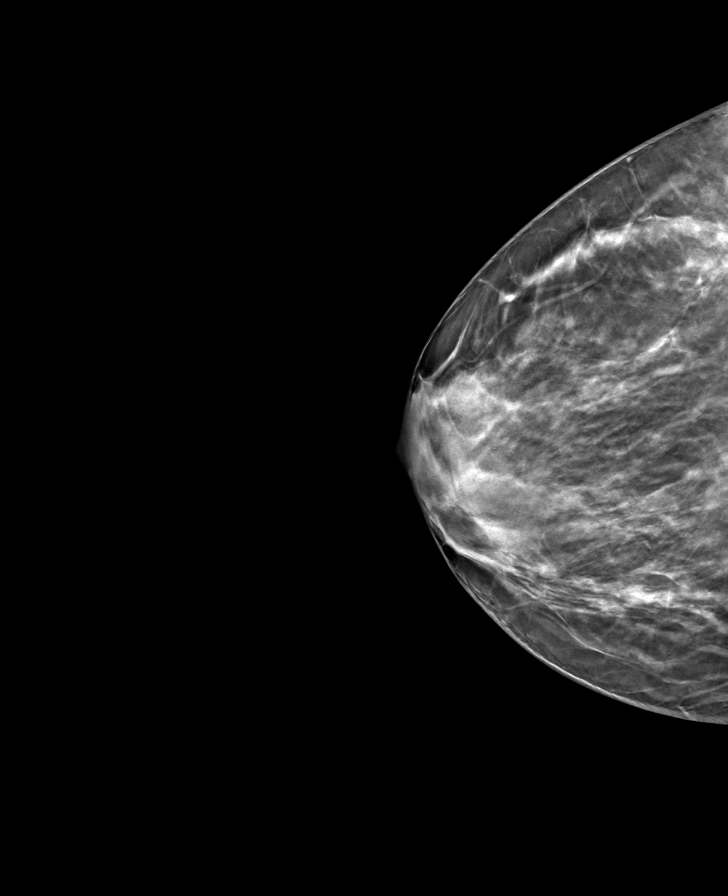

[R MLO tomo · tomo slice 37/73.0]
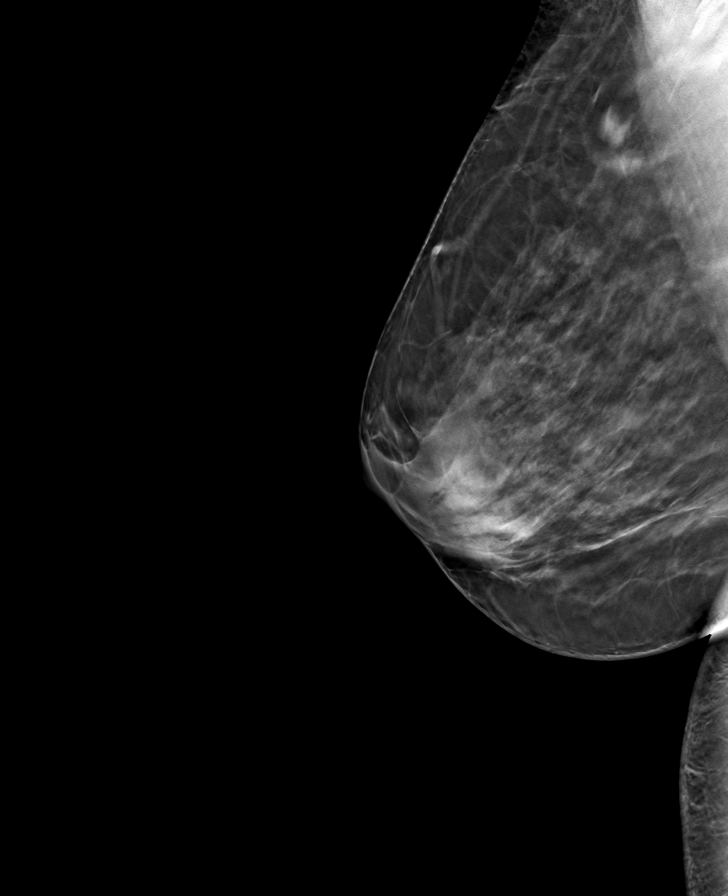

[L CC tomo · tomo slice 33/65.0]
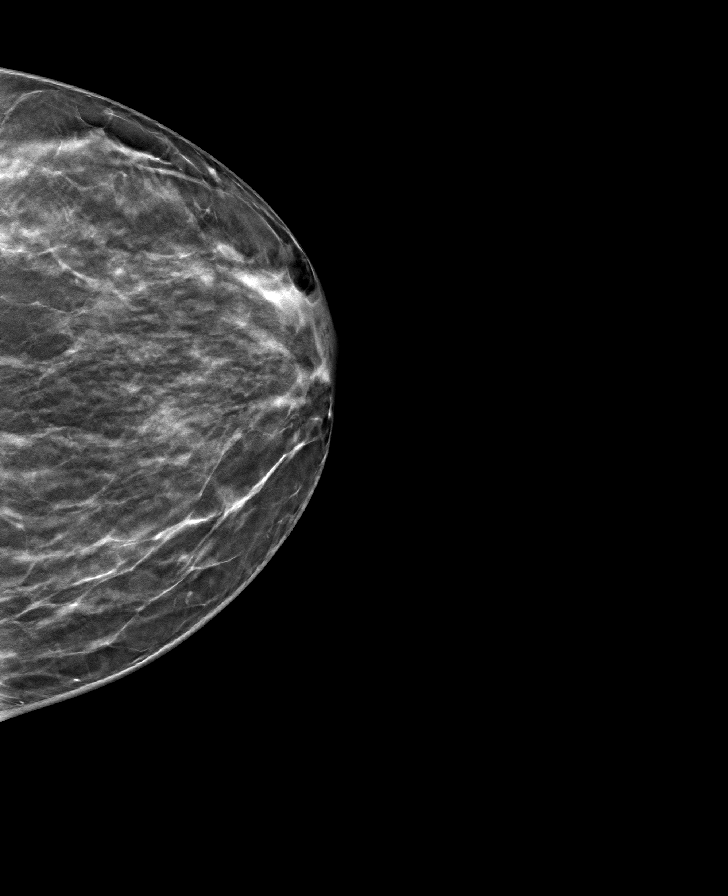

[L MLO tomo · tomo slice 36/71.0]
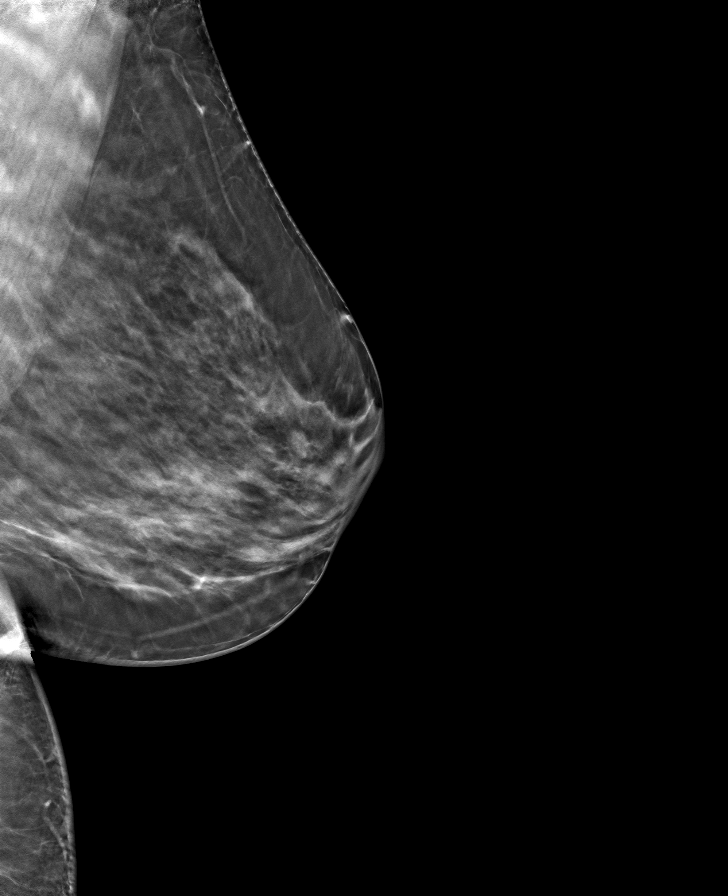

[8 of 24 positions shown; findings below may reference images not displayed]

ACR Breast Density Category c: The breast tissue is heterogeneously
dense, which may obscure small masses.
FINDINGS: There are no findings suspicious for malignancy.
IMPRESSION: No mammographic evidence of malignancy. A result letter of this
screening mammogram will be mailed directly to the patient.

RECOMMENDATION:
Screening mammogram in one year. (Code:Q3-W-BC3)

BI-RADS CATEGORY  1: Negative.

## 2022-01-18 ENCOUNTER — Ambulatory Visit: Payer: Medicare Other

## 2022-01-26 ENCOUNTER — Ambulatory Visit
Admission: RE | Admit: 2022-01-26 | Discharge: 2022-01-26 | Disposition: A | Discharge status: Home / Self Care | Payer: Medicare Other | Source: Ambulatory Visit | Attending: Obstetrics and Gynecology | Admitting: Obstetrics and Gynecology

## 2022-01-26 DIAGNOSIS — Z1231 Encounter for screening mammogram for malignant neoplasm of breast: Secondary | ICD-10-CM

## 2022-04-13 ENCOUNTER — Other Ambulatory Visit: Payer: Self-pay | Admitting: Obstetrics and Gynecology

## 2022-04-13 DIAGNOSIS — R1900 Intra-abdominal and pelvic swelling, mass and lump, unspecified site: Secondary | ICD-10-CM

## 2022-05-10 ENCOUNTER — Ambulatory Visit
Admission: RE | Admit: 2022-05-10 | Discharge: 2022-05-10 | Disposition: A | Discharge status: Home / Self Care | Payer: Medicare Other | Source: Ambulatory Visit | Attending: Obstetrics and Gynecology | Admitting: Obstetrics and Gynecology

## 2022-05-10 DIAGNOSIS — R1900 Intra-abdominal and pelvic swelling, mass and lump, unspecified site: Secondary | ICD-10-CM

## 2022-05-10 MED ORDER — GADOPICLENOL 0.5 MMOL/ML IV SOLN
6.0000 mL | Freq: Once | INTRAVENOUS | Status: AC | PRN
  Administered 2022-05-10: 6 mL via INTRAVENOUS

## 2022-05-14 ENCOUNTER — Telehealth: Payer: Self-pay

## 2022-05-14 NOTE — Telephone Encounter (Signed)
Spoke with the patient regarding the referral to GYN oncology. Patient scheduled as new patient with Dr Ernestina Patches on 05/21/2022. Patient given an arrival time of 10:00am.  Explained to the patient the the doctor will perform a pelvic exam at this visit. Patient given the policy that no visitors under the 16 yrs are allowed in the [facility]. Patient given the address/phone number for the clinic and that the center offers free valet service.

## 2022-05-17 ENCOUNTER — Encounter: Payer: Self-pay | Admitting: Psychiatry

## 2022-05-18 ENCOUNTER — Emergency Department (HOSPITAL_COMMUNITY): Payer: Medicare Other

## 2022-05-18 ENCOUNTER — Encounter (HOSPITAL_COMMUNITY): Payer: Self-pay

## 2022-05-18 ENCOUNTER — Emergency Department (HOSPITAL_COMMUNITY)
Admission: EM | Admit: 2022-05-18 | Discharge: 2022-05-18 | Disposition: A | Discharge status: Home / Self Care | Payer: Medicare Other | Attending: Emergency Medicine | Admitting: Emergency Medicine

## 2022-05-18 DIAGNOSIS — R1030 Lower abdominal pain, unspecified: Secondary | ICD-10-CM | POA: Insufficient documentation

## 2022-05-18 DIAGNOSIS — I10 Essential (primary) hypertension: Secondary | ICD-10-CM | POA: Insufficient documentation

## 2022-05-18 DIAGNOSIS — Z91040 Latex allergy status: Secondary | ICD-10-CM | POA: Insufficient documentation

## 2022-05-18 DIAGNOSIS — R112 Nausea with vomiting, unspecified: Secondary | ICD-10-CM | POA: Insufficient documentation

## 2022-05-18 DIAGNOSIS — R1033 Periumbilical pain: Secondary | ICD-10-CM | POA: Insufficient documentation

## 2022-05-18 DIAGNOSIS — R109 Unspecified abdominal pain: Secondary | ICD-10-CM | POA: Diagnosis present

## 2022-05-18 LAB — URINALYSIS, ROUTINE W REFLEX MICROSCOPIC
Bilirubin Urine: NEGATIVE
Glucose, UA: NEGATIVE mg/dL
Hgb urine dipstick: NEGATIVE
Ketones, ur: NEGATIVE mg/dL
Leukocytes,Ua: NEGATIVE
Nitrite: NEGATIVE
Protein, ur: NEGATIVE mg/dL
Specific Gravity, Urine: 1.024 (ref 1.005–1.030)
pH: 7 (ref 5.0–8.0)

## 2022-05-18 LAB — COMPREHENSIVE METABOLIC PANEL
ALT: 14 U/L (ref 0–44)
AST: 18 U/L (ref 15–41)
Albumin: 4.3 g/dL (ref 3.5–5.0)
Alkaline Phosphatase: 42 U/L (ref 38–126)
Anion gap: 6 (ref 5–15)
BUN: 10 mg/dL (ref 6–20)
CO2: 28 mmol/L (ref 22–32)
Calcium: 9.3 mg/dL (ref 8.9–10.3)
Chloride: 103 mmol/L (ref 98–111)
Creatinine, Ser: 0.63 mg/dL (ref 0.44–1.00)
GFR, Estimated: >60 mL/min (ref >60)
Glucose, Bld: 136 mg/dL — ABNORMAL HIGH (ref 70–99)
Potassium: 3.5 mmol/L (ref 3.5–5.1)
Sodium: 137 mmol/L (ref 135–145)
Total Bilirubin: 0.6 mg/dL (ref 0.3–1.2)
Total Protein: 8 g/dL (ref 6.5–8.1)

## 2022-05-18 LAB — CBC WITH DIFFERENTIAL/PLATELET
Abs Immature Granulocytes: 0.02 10*3/uL (ref 0.00–0.07)
Basophils Absolute: 0 10*3/uL (ref 0.0–0.1)
Basophils Relative: 0 %
Eosinophils Absolute: 0 10*3/uL (ref 0.0–0.5)
Eosinophils Relative: 0 %
HCT: 37.2 % (ref 36.0–46.0)
Hemoglobin: 12.9 g/dL (ref 12.0–15.0)
Immature Granulocytes: 0 %
Lymphocytes Relative: 26 %
Lymphs Abs: 2.3 10*3/uL (ref 0.7–4.0)
MCH: 29.5 pg (ref 26.0–34.0)
MCHC: 34.7 g/dL (ref 30.0–36.0)
MCV: 85.1 fL (ref 80.0–100.0)
Monocytes Absolute: 0.3 10*3/uL (ref 0.1–1.0)
Monocytes Relative: 4 %
Neutro Abs: 6.1 10*3/uL (ref 1.7–7.7)
Neutrophils Relative %: 70 %
Platelets: 360 10*3/uL (ref 150–400)
RBC: 4.37 MIL/uL (ref 3.87–5.11)
RDW: 12.9 % (ref 11.5–15.5)
WBC: 8.8 10*3/uL (ref 4.0–10.5)
nRBC: 0 % (ref 0.0–0.2)

## 2022-05-18 LAB — LIPASE, BLOOD: Lipase: 33 U/L (ref 11–51)

## 2022-05-18 MED ORDER — MORPHINE SULFATE (PF) 4 MG/ML IV SOLN
4.0000 mg | Freq: Once | INTRAVENOUS | Status: AC
  Administered 2022-05-18: 4 mg via INTRAVENOUS
  Filled 2022-05-18: qty 1

## 2022-05-18 MED ORDER — OXYCODONE HCL 5 MG PO TABS: 5.0000 mg | ORAL_TABLET | ORAL | 0 refills | Status: DC | PRN

## 2022-05-18 MED ORDER — ONDANSETRON HCL 4 MG/2ML IJ SOLN
4.0000 mg | Freq: Once | INTRAMUSCULAR | Status: AC
  Administered 2022-05-18: 4 mg via INTRAVENOUS
  Filled 2022-05-18: qty 2

## 2022-05-18 MED ORDER — IOHEXOL 300 MG/ML  SOLN
100.0000 mL | Freq: Once | INTRAMUSCULAR | Status: AC | PRN
  Administered 2022-05-18: 100 mL via INTRAVENOUS

## 2022-05-18 MED ORDER — HYDROMORPHONE HCL 1 MG/ML IJ SOLN
0.5000 mg | Freq: Once | INTRAMUSCULAR | Status: AC
  Administered 2022-05-18: 0.5 mg via INTRAVENOUS
  Filled 2022-05-18: qty 1

## 2022-05-18 MED ORDER — HYDROMORPHONE HCL 1 MG/ML IJ SOLN
1.0000 mg | Freq: Once | INTRAMUSCULAR | Status: AC
  Administered 2022-05-18: 1 mg via INTRAVENOUS
  Filled 2022-05-18: qty 1

## 2022-05-18 MED ORDER — LACTATED RINGERS IV BOLUS
1000.0000 mL | Freq: Once | INTRAVENOUS | Status: AC
  Administered 2022-05-18: 1000 mL via INTRAVENOUS

## 2022-05-18 MED ORDER — SODIUM CHLORIDE (PF) 0.9 % IJ SOLN
INTRAMUSCULAR | Status: AC
  Filled 2022-05-18: qty 50

## 2022-05-18 NOTE — ED Provider Notes (Signed)
[facility] DEPT Provider Note   CSN: [id number] Arrival date & time: 05/18/22  0001     History  Chief Complaint  Patient presents with   Back Pain    Erin Ray is a 41 y.o. 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  Patient has been referred to gynecologic oncology and is awaiting scheduling of surgery for removal.  She states that for the last 10 years she has had episodes of very severe pain every month which she has been informed by her OB/GYN is likely related to this mass.  She presents this evening in severe pain, last episode 1 month ago.  Associated nausea and vomiting secondary to severity of pain.  She denies fevers, chills.  Does endorse dysuria but denies urinary frequency or urgency. Vomiting NBNB, began ~ 6 h prior to my evaluation.  I personally reviewed her medical records.  She has history of meningitis secondary to [place] virus in 2011, anemia prior to hysterectomy in 2014, GERD.  She is not on any anticoagulation.  HPI     Home Medications Prior to Admission medications   Medication Sig Start Date End Date Taking? Authorizing Provider  oxyCODONE (ROXICODONE) 5 MG immediate release tablet Take 1 tablet (5 mg total) by mouth every 4 (four) hours as needed for severe pain. 05/18/22  Yes Claudell Rhody, Gypsy Balsam, PA-C  acetaminophen (TYLENOL) 500 MG tablet Take 500-1,000 mg by mouth every 6 (six) hours as needed (for pain or headaches).    [provider]  calcium carbonate (TUMS - DOSED IN MG ELEMENTAL CALCIUM) 500 MG chewable tablet Chew 1 tablet by mouth 2 (two) times daily as needed for heartburn.     [provider]  cetirizine (ZYRTEC) 10 MG tablet Take 10 mg by mouth daily.    [provider]  diphenhydrAMINE (BENADRYL) 12.5 MG/5ML liquid Take 25 mg by mouth 4 (four) times daily as needed for allergies (or cold  symptoms).     [provider]  EPINEPHrine (EPIPEN 2-PAK) 0.3 mg/0.3 mL IJ SOAJ injection Inject 0.3 mLs into the muscle once as needed for anaphylaxis.    [provider]  fluticasone (FLONASE) 50 MCG/ACT nasal spray Place 1 spray into both nostrils 2 (two) times daily as needed for allergies.     [provider]  guaifenesin (ROBITUSSIN) 100 MG/5ML syrup Take 200 mg by mouth 3 (three) times daily as needed for cough.    [provider]  norethindrone-ethinyl estradiol (MICROGESTIN,JUNEL,LOESTRIN) 1-20 MG-MCG tablet Take 1 tablet by mouth daily. 10/30/16   [provider]  pseudoephedrine (SUDAFED) 120 MG 12 hr tablet Take 120 mg by mouth 2 (two) times daily as needed for congestion.     [provider]      Allergies    Amoxicillin, Cefuroxime axetil, Allopurinol [acetaminophen], Latex, and Tape    Review of Systems   Review of Systems  Gastrointestinal:  Positive for abdominal pain, nausea and vomiting.  Genitourinary:  Positive for dysuria. Negative for decreased urine volume, flank pain, frequency, urgency, vaginal bleeding, vaginal discharge and vaginal pain.  Musculoskeletal:  Positive for back pain.    Physical Exam Updated Vital Signs BP (!) 155/78 (BP Location: Right Arm)   Pulse (!) 59   Temp 97.7 F (36.5 C) (Oral)   Resp 18   LMP 08/13/2012   SpO2 100%  Physical Exam Vitals and nursing note reviewed.  Constitutional:      Appearance: She is not ill-appearing or toxic-appearing.  HENT:     Head: Normocephalic and atraumatic.     Mouth/Throat:     Mouth: Mucous membranes are moist.     Pharynx: No oropharyngeal exudate or posterior oropharyngeal erythema.  Eyes:     General:        Right eye: No discharge.        Left eye: No discharge.     Conjunctiva/sclera: Conjunctivae normal.     Pupils: Pupils are equal, round, and reactive to light.  Cardiovascular:     Rate and Rhythm: Normal rate and regular rhythm.      Pulses: Normal pulses.     Heart sounds: Normal heart sounds. No murmur heard. Pulmonary:     Effort: Pulmonary effort is normal. No respiratory distress.     Breath sounds: Normal breath sounds. No wheezing or rales.  Abdominal:     General: Bowel sounds are normal. There is no distension.     Palpations: Abdomen is soft.     Tenderness: There is abdominal tenderness in the periumbilical area and suprapubic area. There is no right CVA tenderness, left CVA tenderness, guarding or rebound.     Comments: Generalized abdominal tenderness palpation worse in the periumbilical  Musculoskeletal:        General: No deformity.     Cervical back: Neck supple.     Right lower leg: No edema.     Left lower leg: No edema.  Skin:    General: Skin is warm and dry.     Capillary Refill: Capillary refill takes less than 2 seconds.  Neurological:     General: No focal deficit present.     Mental Status: She is alert and oriented to person, place, and time. Mental status is at baseline.  Psychiatric:        Mood and Affect: Mood normal.     ED Results / Procedures / Treatments   Labs (all labs ordered are listed, but only abnormal results are displayed) Labs Reviewed  COMPREHENSIVE METABOLIC PANEL - Abnormal; Notable for the following components:      Result Value   Glucose, Bld 136 (*)    All other components within normal limits  URINALYSIS, ROUTINE W REFLEX MICROSCOPIC - Abnormal; Notable for the following components:   APPearance HAZY (*)    Bacteria, UA RARE (*)    All other components within normal limits  CBC WITH DIFFERENTIAL/PLATELET  LIPASE, BLOOD    EKG None  Radiology CT ABDOMEN PELVIS W CONTRAST  Result Date: 05/18/2022 CLINICAL DATA:  Acute, nonlocalized abdominal pain.  Pelvic mass. EXAM: CT ABDOMEN AND PELVIS WITH CONTRAST TECHNIQUE: Multidetector CT imaging of the abdomen and pelvis was performed using the standard protocol following bolus administration of  intravenous contrast. RADIATION DOSE REDUCTION: This exam was performed according to the departmental dose-optimization program which includes automated exposure control, adjustment of the mA and/or kV according to patient size and/or use of iterative reconstruction technique. CONTRAST:  149m OMNIPAQUE IOHEXOL 300 MG/ML  SOLN COMPARISON:  05/10/2022 pelvis MRI FINDINGS: Lower chest:  No contributory findings. Hepatobiliary: No focal liver abnormality.Cholelithiasis. No evidence of gallbladder inflammation. Mild intrahepatic biliary duct prominence Pancreas: Unremarkable. Spleen: Unremarkable. Adrenals/Urinary Tract: Negative adrenals. No hydronephrosis or stone. Cystic densities in the kidney measuring up to 3.3 cm on the left. No imaging follow-up recommended. Unremarkable bladder. Stomach/Bowel:  No obstruction. No appendicitis. Vascular/Lymphatic: No acute vascular abnormality. No mass or  adenopathy. Reproductive:Rounded central pelvic mass with avid enhancement/increased density measuring 4 cm in diameter. Just superiorly along the sacral promontory is and adjacent more heterogeneous mass measuring 3.2 x 2 cm. These are unchanged from recent pelvis MRI. Hysterectomy. No lateral pelvic/adnexal masses. Other: Fatty midline epigastric hernia. Small volume pelvic fluid also seen on prior MRI. Musculoskeletal: No acute abnormalities. IMPRESSION: 1. No acute finding. 2. Pelvic and presacral mass/es recently evaluated by pelvis MRI. 3. Small volume pelvic fluid also seen on prior MRI. 4. Cholelithiasis and midline fatty hernia. Electronically Signed   By: Jorje Guild M.D.   On: 05/18/2022 04:15    Procedures Procedures   Medications Ordered in ED Medications  ondansetron (ZOFRAN) injection 4 mg (4 mg Intravenous Given 05/18/22 0058)  morphine (PF) 4 MG/ML injection 4 mg (4 mg Intravenous Given 05/18/22 0059)  lactated ringers bolus 1,000 mL (1,000 mLs Intravenous New Bag/Given 05/18/22 0057)   HYDROmorphone (DILAUDID) injection 0.5 mg (0.5 mg Intravenous Given 05/18/22 0235)  iohexol (OMNIPAQUE) 300 MG/ML solution 100 mL (100 mLs Intravenous Contrast Given 05/18/22 0334)  sodium chloride (PF) 0.9 % injection (  Given 05/18/22 0420)  HYDROmorphone (DILAUDID) injection 1 mg (1 mg Intravenous Given 05/18/22 0531)    ED Course/ Medical Decision Making/ A&P                           Medical Decision Making 41 year old female presents with concern for abdominal pain and radiation of the pain to the back as well as associated nausea vomiting and dysuria.  Hypertension on intake, vitals otherwise normal.  Cardiopulmonary sounds normal, abdominal exam as above with tenderness palpation generally worse in the central abdomen, no CVAT.  Patient with reassuring vital signs, will appears well-hydrated with moist mucous membranes and normal skin turgor as well as heart rate.  DDx includes not limited to UTI/pyelonephritis/pain associated with her known pelvic mass, other infectious etiology such as diverticulitis or appendicitis, pancreatitis    Amount and/or Complexity of Data Reviewed Labs: ordered.    Details: CBC without leukocytosis or anemia, CMP with no acute metabolic, renal, or hepatic abnormality.  Lipase is normal, UA without evidence of infection. Radiology: ordered.    Details: CT abdomen pelvis with no acute findings, pelvic and presacral masses as well as pelvic fluid seen on MRI from 1/13.  Images visualized with provider.    Risk Prescription drug management.   Overall patient's work-up is very reassuring.  Suspect her pain is secondary to her known pelvic and presacral masses.  She states that pain is unchanged from her baseline which she experiences monthly.  Will offer 1 more dose of analgesia in the ED and discharged with recommendation to follow-up closely with gynecologic oncology as previously scheduled.  Erin Ray  voiced understanding of her medical evaluation  and treatment plan. Each of their questions answered to their expressed satisfaction.  Return precautions were given.  Patient is well-appearing, stable, and was discharged in good condition.  This chart was dictated using voice recognition software, Dragon. Despite the best efforts of this provider to proofread and correct errors, errors may still occur which can change documentation meaning.  Final Clinical Impression(s) / ED Diagnoses Final diagnoses:  Lower abdominal pain    Rx / DC Orders ED Discharge Orders          Ordered    oxyCODONE (ROXICODONE) 5 MG immediate release tablet  Every 4 hours PRN  05/18/22 0655              Emeline Darling, PA-C 05/18/22 [place], MD 05/18/22 [id number]

## 2022-05-18 NOTE — ED Triage Notes (Signed)
Patient arrived stating she has a fibroid, reports lower back pain and NV tonight. Scheduled for surgery Monday.

## 2022-05-18 NOTE — Discharge Instructions (Addendum)
You are seen in the ER today for your abdominal pain.  This appears to be related to your previous identified pelvic masses.  Please follow-up with the gynecologic oncologist listed below and return to the ER with any severe symptoms.

## 2022-05-19 ENCOUNTER — Emergency Department (HOSPITAL_COMMUNITY)
Admission: EM | Admit: 2022-05-19 | Discharge: 2022-05-19 | Disposition: A | Discharge status: Home / Self Care | Payer: Medicare Other | Attending: Emergency Medicine | Admitting: Emergency Medicine

## 2022-05-19 ENCOUNTER — Other Ambulatory Visit: Payer: Self-pay

## 2022-05-19 ENCOUNTER — Encounter (HOSPITAL_COMMUNITY): Payer: Self-pay | Admitting: *Deleted

## 2022-05-19 DIAGNOSIS — R1030 Lower abdominal pain, unspecified: Secondary | ICD-10-CM | POA: Diagnosis not present

## 2022-05-19 DIAGNOSIS — Z91040 Latex allergy status: Secondary | ICD-10-CM | POA: Diagnosis not present

## 2022-05-19 DIAGNOSIS — R112 Nausea with vomiting, unspecified: Secondary | ICD-10-CM | POA: Insufficient documentation

## 2022-05-19 DIAGNOSIS — R109 Unspecified abdominal pain: Secondary | ICD-10-CM | POA: Diagnosis present

## 2022-05-19 LAB — COMPREHENSIVE METABOLIC PANEL
ALT: 13 U/L (ref 0–44)
AST: 16 U/L (ref 15–41)
Albumin: 3.8 g/dL (ref 3.5–5.0)
Alkaline Phosphatase: 36 U/L — ABNORMAL LOW (ref 38–126)
Anion gap: 7 (ref 5–15)
BUN: <5 mg/dL — ABNORMAL LOW (ref 6–20)
CO2: 28 mmol/L (ref 22–32)
Calcium: 8.9 mg/dL (ref 8.9–10.3)
Chloride: 99 mmol/L (ref 98–111)
Creatinine, Ser: 0.64 mg/dL (ref 0.44–1.00)
GFR, Estimated: >60 mL/min (ref >60)
Glucose, Bld: 119 mg/dL — ABNORMAL HIGH (ref 70–99)
Potassium: 3.2 mmol/L — ABNORMAL LOW (ref 3.5–5.1)
Sodium: 134 mmol/L — ABNORMAL LOW (ref 135–145)
Total Bilirubin: 0.8 mg/dL (ref 0.3–1.2)
Total Protein: 7.1 g/dL (ref 6.5–8.1)

## 2022-05-19 LAB — URINALYSIS, ROUTINE W REFLEX MICROSCOPIC
Bilirubin Urine: NEGATIVE
Glucose, UA: NEGATIVE mg/dL
Hgb urine dipstick: NEGATIVE
Ketones, ur: NEGATIVE mg/dL
Leukocytes,Ua: NEGATIVE
Nitrite: NEGATIVE
Protein, ur: NEGATIVE mg/dL
Specific Gravity, Urine: 1.006 (ref 1.005–1.030)
pH: 6 (ref 5.0–8.0)

## 2022-05-19 LAB — CBC WITH DIFFERENTIAL/PLATELET
Abs Immature Granulocytes: 0.02 10*3/uL (ref 0.00–0.07)
Basophils Absolute: 0 10*3/uL (ref 0.0–0.1)
Basophils Relative: 0 %
Eosinophils Absolute: 0 10*3/uL (ref 0.0–0.5)
Eosinophils Relative: 0 %
HCT: 38.2 % (ref 36.0–46.0)
Hemoglobin: 13 g/dL (ref 12.0–15.0)
Immature Granulocytes: 0 %
Lymphocytes Relative: 19 %
Lymphs Abs: 1.9 10*3/uL (ref 0.7–4.0)
MCH: 28.9 pg (ref 26.0–34.0)
MCHC: 34 g/dL (ref 30.0–36.0)
MCV: 84.9 fL (ref 80.0–100.0)
Monocytes Absolute: 1 10*3/uL (ref 0.1–1.0)
Monocytes Relative: 9 %
Neutro Abs: 7.5 10*3/uL (ref 1.7–7.7)
Neutrophils Relative %: 72 %
Platelets: 348 10*3/uL (ref 150–400)
RBC: 4.5 MIL/uL (ref 3.87–5.11)
RDW: 12.9 % (ref 11.5–15.5)
WBC: 10.5 10*3/uL (ref 4.0–10.5)
nRBC: 0 % (ref 0.0–0.2)

## 2022-05-19 LAB — LIPASE, BLOOD: Lipase: 32 U/L (ref 11–51)

## 2022-05-19 MED ORDER — KETOROLAC TROMETHAMINE 15 MG/ML IJ SOLN
15.0000 mg | Freq: Once | INTRAMUSCULAR | Status: AC
  Administered 2022-05-19: 15 mg via INTRAVENOUS
  Filled 2022-05-19: qty 1

## 2022-05-19 MED ORDER — HYDROMORPHONE HCL 1 MG/ML IJ SOLN
1.0000 mg | Freq: Once | INTRAMUSCULAR | Status: AC
  Administered 2022-05-19: 1 mg via INTRAVENOUS
  Filled 2022-05-19: qty 1

## 2022-05-19 MED ORDER — ONDANSETRON 4 MG PO TBDP: 4.0000 mg | ORAL_TABLET | Freq: Three times a day (TID) | ORAL | 0 refills | Status: DC | PRN

## 2022-05-19 MED ORDER — ONDANSETRON HCL 4 MG/2ML IJ SOLN
4.0000 mg | Freq: Once | INTRAMUSCULAR | Status: AC
  Administered 2022-05-19: 4 mg via INTRAVENOUS
  Filled 2022-05-19: qty 2

## 2022-05-19 MED ORDER — MORPHINE SULFATE (PF) 4 MG/ML IV SOLN
4.0000 mg | Freq: Once | INTRAVENOUS | Status: AC
  Administered 2022-05-19: 4 mg via INTRAVENOUS
  Filled 2022-05-19: qty 1

## 2022-05-19 MED ORDER — ONDANSETRON 4 MG PO TBDP
4.0000 mg | ORAL_TABLET | Freq: Once | ORAL | Status: AC
  Administered 2022-05-19: 4 mg via ORAL
  Filled 2022-05-19: qty 1

## 2022-05-19 MED ORDER — LACTATED RINGERS IV BOLUS
1000.0000 mL | Freq: Once | INTRAVENOUS | Status: AC
  Administered 2022-05-19: 1000 mL via INTRAVENOUS

## 2022-05-19 NOTE — ED Notes (Signed)
Patient verbalizes understanding of discharge instructions. Opportunity for questioning and answers were provided. Armband removed by staff, pt discharged from ED. Ambulated out to lobby with mother  

## 2022-05-19 NOTE — ED Provider Notes (Signed)
[place] DEPT Provider Note   CSN: [id number] Arrival date & time: 05/19/22  1327     History  Chief Complaint  Patient presents with   Pelvic Pain    Erin Ray is a 41 y.o. female.  41 year old female with past medical history significant for hysterectomy, mild persistent ectopic leiomyoma, bilateral salpingectomy who presents today for evaluation of abdominal pain.  She states this is consistent with her fibroid pain.  She was recently seen here for this pain 2 days ago and had a CT done which showed pelvic and presacral masses consistent with the leiomyoma seen on the recent MRI pelvis.  She states she has an appointment coming with OB/GYN for consultation of surgery to remove the fibroid.  She denies any change in symptoms.  She states she had transient relief after she left the emergency room the other night however soon after she developed pain and this morning she had some nausea and vomiting.  Denies dysuria, flank pain, vaginal discharge, vaginal bleeding.  The history is provided by the patient. No language interpreter was used.       Home Medications Prior to Admission medications   Medication Sig Start Date End Date Taking? Authorizing Provider  acetaminophen (TYLENOL) 500 MG tablet Take 500-1,000 mg by mouth every 6 (six) hours as needed (for pain or headaches).    [provider]  calcium carbonate (TUMS - DOSED IN MG ELEMENTAL CALCIUM) 500 MG chewable tablet Chew 1 tablet by mouth 2 (two) times daily as needed for heartburn.     [provider]  cetirizine (ZYRTEC) 10 MG tablet Take 10 mg by mouth daily.    [provider]  diphenhydrAMINE (BENADRYL) 12.5 MG/5ML liquid Take 25 mg by mouth 4 (four) times daily as needed for allergies (or cold symptoms).     [provider]  EPINEPHrine (EPIPEN 2-PAK) 0.3 mg/0.3 mL IJ SOAJ injection Inject 0.3 mLs into the muscle once as needed for anaphylaxis.     [provider]  fluticasone (FLONASE) 50 MCG/ACT nasal spray Place 1 spray into both nostrils 2 (two) times daily as needed for allergies.     [provider]  guaifenesin (ROBITUSSIN) 100 MG/5ML syrup Take 200 mg by mouth 3 (three) times daily as needed for cough.    [provider]  norethindrone-ethinyl estradiol (MICROGESTIN,JUNEL,LOESTRIN) 1-20 MG-MCG tablet Take 1 tablet by mouth daily. 10/30/16   [provider]  oxyCODONE (ROXICODONE) 5 MG immediate release tablet Take 1 tablet (5 mg total) by mouth every 4 (four) hours as needed for severe pain. 05/18/22   Sponseller, Gypsy Balsam, PA-C  pseudoephedrine (SUDAFED) 120 MG 12 hr tablet Take 120 mg by mouth 2 (two) times daily as needed for congestion.     [provider]      Allergies    Amoxicillin, Cefuroxime axetil, Allopurinol [acetaminophen], Latex, and Tape    Review of Systems   Review of Systems  Constitutional:  Negative for chills and fever.  Respiratory:  Negative for shortness of breath.   Cardiovascular:  Negative for chest pain.  Gastrointestinal:  Positive for abdominal pain, nausea and vomiting. Negative for diarrhea.  Neurological:  Negative for light-headedness.  All other systems reviewed and are negative.   Physical Exam Updated Vital Signs BP (!) 149/92   Pulse 67   Temp 98 F (36.7 C) (Oral)   Resp 14   Ht '5\' 2"'$  (1.575 m)   Wt 65.3 kg  LMP 08/13/2012   SpO2 100%   BMI 26.34 kg/m  Physical Exam Vitals and nursing note reviewed.  Constitutional:      General: She is not in acute distress.    Appearance: Normal appearance. She is not ill-appearing.  HENT:     Head: Normocephalic and atraumatic.     Nose: Nose normal.  Eyes:     General: No scleral icterus.    Extraocular Movements: Extraocular movements intact.     Conjunctiva/sclera: Conjunctivae normal.  Cardiovascular:     Rate and Rhythm: Normal rate and regular rhythm.     Pulses: Normal pulses.      Heart sounds: Normal heart sounds.  Pulmonary:     Effort: Pulmonary effort is normal. No respiratory distress.     Breath sounds: Normal breath sounds. No wheezing or rales.  Abdominal:     General: There is no distension.     Tenderness: There is abdominal tenderness. There is no right CVA tenderness, left CVA tenderness or guarding.  Musculoskeletal:        General: Normal range of motion.     Cervical back: Normal range of motion.  Skin:    General: Skin is warm and dry.  Neurological:     General: No focal deficit present.     Mental Status: She is alert and oriented to person, place, and time. Mental status is at baseline.     ED Results / Procedures / Treatments   Labs (all labs ordered are listed, but only abnormal results are displayed) Labs Reviewed  COMPREHENSIVE METABOLIC PANEL - Abnormal; Notable for the following components:      Result Value   Sodium 134 (*)    Potassium 3.2 (*)    Glucose, Bld 119 (*)    BUN <5 (*)    Alkaline Phosphatase 36 (*)    All other components within normal limits  URINALYSIS, ROUTINE W REFLEX MICROSCOPIC - Abnormal; Notable for the following components:   Bacteria, UA FEW (*)    All other components within normal limits  CBC WITH DIFFERENTIAL/PLATELET  LIPASE, BLOOD    EKG None  Radiology CT ABDOMEN PELVIS W CONTRAST  Result Date: 05/18/2022 CLINICAL DATA:  Acute, nonlocalized abdominal pain.  Pelvic mass. EXAM: CT ABDOMEN AND PELVIS WITH CONTRAST TECHNIQUE: Multidetector CT imaging of the abdomen and pelvis was performed using the standard protocol following bolus administration of intravenous contrast. RADIATION DOSE REDUCTION: This exam was performed according to the departmental dose-optimization program which includes automated exposure control, adjustment of the mA and/or kV according to patient size and/or use of iterative reconstruction technique. CONTRAST:  132m OMNIPAQUE IOHEXOL 300 MG/ML  SOLN COMPARISON:   05/10/2022 pelvis MRI FINDINGS: Lower chest:  No contributory findings. Hepatobiliary: No focal liver abnormality.Cholelithiasis. No evidence of gallbladder inflammation. Mild intrahepatic biliary duct prominence Pancreas: Unremarkable. Spleen: Unremarkable. Adrenals/Urinary Tract: Negative adrenals. No hydronephrosis or stone. Cystic densities in the kidney measuring up to 3.3 cm on the left. No imaging follow-up recommended. Unremarkable bladder. Stomach/Bowel:  No obstruction. No appendicitis. Vascular/Lymphatic: No acute vascular abnormality. No mass or adenopathy. Reproductive:Rounded central pelvic mass with avid enhancement/increased density measuring 4 cm in diameter. Just superiorly along the sacral promontory is and adjacent more heterogeneous mass measuring 3.2 x 2 cm. These are unchanged from recent pelvis MRI. Hysterectomy. No lateral pelvic/adnexal masses. Other: Fatty midline epigastric hernia. Small volume pelvic fluid also seen on prior MRI. Musculoskeletal: No acute abnormalities. IMPRESSION: 1. No acute finding. 2. Pelvic and presacral  mass/es recently evaluated by pelvis MRI. 3. Small volume pelvic fluid also seen on prior MRI. 4. Cholelithiasis and midline fatty hernia. Electronically Signed   By: Jorje Guild M.D.   On: 05/18/2022 04:15    Procedures Procedures    Medications Ordered in ED Medications  HYDROmorphone (DILAUDID) injection 1 mg (has no administration in time range)  ketorolac (TORADOL) 15 MG/ML injection 15 mg (has no administration in time range)  ondansetron (ZOFRAN-ODT) disintegrating tablet 4 mg (4 mg Oral Given 05/19/22 1531)  lactated ringers bolus 1,000 mL (1,000 mLs Intravenous New Bag/Given 05/19/22 1746)  morphine (PF) 4 MG/ML injection 4 mg (4 mg Intravenous Given 05/19/22 1743)  ondansetron (ZOFRAN) injection 4 mg (4 mg Intravenous Given 05/19/22 1743)    ED Course/ Medical Decision Making/ A&P Clinical Course as of 05/19/22 1901  Sat May 19, 2022   1900 After initial round of pain medication patient reports no improvement in her symptoms.  Will give dose of Dilaudid, Toradol. [AA]    Clinical Course User Index [AA] Evlyn Courier, PA-C                           Medical Decision Making Risk Prescription drug management.   Medical Decision Making / ED Course   This patient presents to the ED for concern of abdominal pain, this involves an extensive number of treatment options, and is a complaint that carries with it a high risk of complications and morbidity.  The differential diagnosis includes flareup of her fibroids.  UTI, pyelonephritis.  Low likelihood of UTI or pyelonephritis as she is without dysuria, flank pain.  Denies vaginal discharge.  Low suspicion for PID.  MDM: 40 year old female with past medical history significant as above presents today for lower abdominal pain.  States this is consistent with her fibroid pain.  Denies any change in quality.  Was seen for this 2 days ago.  Had some relief however symptoms returned today.  She is taking the home hydrocodone she was prescribed without significant relief.  Has not taken anything else.  Overall well-appearing.  Did have an episode of vomiting today but not as worse as it was a couple days ago.  Abdomen is tender generally however no guarding or distention.  States she has an appointment with the surgical gynecologist on Monday for consultation.  She has undergone abdominal hysterectomy as well as bilateral salpingectomy.  This is believed to be an ectopic leiomyoma.  Patient given multiple rounds of pain medication in the emergency room with improvement in symptoms.  Return precautions discussed.  Discussed NSAID use for pain control and reserving the hydrocodone for breakthrough pain.  Patient is in agreement with this plan.  Zofran ODT prescribed to keep on hand for nausea.  Patient voices understanding and is in agreement with plan.  We did discuss CT scan however no change in  symptoms and just had this done 2 days ago without other acute process patient would like to defer this today.  Patient is appropriate for discharge.  Discharged in stable condition.  Return precaution discussed    Additional history obtained: -Additional history obtained from recent ED visit -External records from outside source obtained and reviewed including: Chart review including previous notes, labs, imaging, consultation notes   Lab Tests: -I ordered, reviewed, and interpreted labs.   The pertinent results include:   Labs Reviewed  COMPREHENSIVE METABOLIC PANEL - Abnormal; Notable for the following components:  Result Value   Sodium 134 (*)    Potassium 3.2 (*)    Glucose, Bld 119 (*)    BUN <5 (*)    Alkaline Phosphatase 36 (*)    All other components within normal limits  URINALYSIS, ROUTINE W REFLEX MICROSCOPIC - Abnormal; Notable for the following components:   Bacteria, UA FEW (*)    All other components within normal limits  CBC WITH DIFFERENTIAL/PLATELET  LIPASE, BLOOD      EKG  EKG Interpretation  Date/Time:    Ventricular Rate:    PR Interval:    QRS Duration:   QT Interval:    QTC Calculation:   R Axis:     Text Interpretation:          Medicines ordered and prescription drug management: Meds ordered this encounter  Medications   ondansetron (ZOFRAN-ODT) disintegrating tablet 4 mg   lactated ringers bolus 1,000 mL   morphine (PF) 4 MG/ML injection 4 mg   ondansetron (ZOFRAN) injection 4 mg   HYDROmorphone (DILAUDID) injection 1 mg   ketorolac (TORADOL) 15 MG/ML injection 15 mg   ondansetron (ZOFRAN-ODT) 4 MG disintegrating tablet    Sig: Take 1 tablet (4 mg total) by mouth every 8 (eight) hours as needed.    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  Discharged in stable condition.  Return precaution discussed.   Final Clinical Impression(s) / ED Diagnoses Final diagnoses:  Lower abdominal pain    Rx / DC Orders ED Discharge Orders          Ordered    ondansetron (ZOFRAN-ODT) 4 MG disintegrating tablet  Every 8 hours PRN        05/19/22 2121              Evlyn Courier, PA-C 05/19/22 2126    Ezequiel Essex, MD 05/19/22 2218

## 2022-05-19 NOTE — Discharge Instructions (Signed)
Your work-up today was overall reassuring.  Your pain is likely from the fibroids as you mention.  There is been no change in your symptoms.  We discussed doing a CT scan however given that she has not had any change in the quality of your symptoms the risk of radiation associated with CT scan you agreed to defer this today.  He did have some improvement with your pain after couple rounds of pain medication in the emergency room.  Particularly after receiving Toradol.  We discussed that ibuprofen and other NSAIDs worked better for fibroids.  I recommend that you take at least 600 mg of ibuprofen 3 times daily for the next 5 to 7 days.  Do not take this for longer than a week consistently.  You can also take Tylenol along with this.  You can continue taking the hydrocodone that was previously prescribed for severe or breakthrough pain.  Keep your appointment with the gynecologist for Monday for the surgical consultation.  For any worsening symptoms return to the emergency room.

## 2022-05-19 NOTE — ED Triage Notes (Signed)
Pt states she is having Pelvic pain from a fibroid and abd pain. Seen Thursday for same.

## 2022-05-21 ENCOUNTER — Inpatient Hospital Stay: Payer: Medicare Other | Attending: Psychiatry | Admitting: Psychiatry

## 2022-05-21 ENCOUNTER — Encounter: Payer: Self-pay | Admitting: Psychiatry

## 2022-05-21 ENCOUNTER — Other Ambulatory Visit: Payer: Self-pay

## 2022-05-21 VITALS — BP 133/69 | HR 74 | Temp 98.4°F | Resp 16 | Ht 62.0 in | Wt 142.0 lb

## 2022-05-21 DIAGNOSIS — Z800 Family history of malignant neoplasm of digestive organs: Secondary | ICD-10-CM | POA: Insufficient documentation

## 2022-05-21 DIAGNOSIS — Z9079 Acquired absence of other genital organ(s): Secondary | ICD-10-CM | POA: Diagnosis not present

## 2022-05-21 DIAGNOSIS — K219 Gastro-esophageal reflux disease without esophagitis: Secondary | ICD-10-CM | POA: Diagnosis not present

## 2022-05-21 DIAGNOSIS — R6881 Early satiety: Secondary | ICD-10-CM | POA: Diagnosis not present

## 2022-05-21 DIAGNOSIS — Z801 Family history of malignant neoplasm of trachea, bronchus and lung: Secondary | ICD-10-CM | POA: Diagnosis not present

## 2022-05-21 DIAGNOSIS — K5900 Constipation, unspecified: Secondary | ICD-10-CM | POA: Diagnosis not present

## 2022-05-21 DIAGNOSIS — R1900 Intra-abdominal and pelvic swelling, mass and lump, unspecified site: Secondary | ICD-10-CM | POA: Diagnosis present

## 2022-05-21 DIAGNOSIS — R109 Unspecified abdominal pain: Secondary | ICD-10-CM | POA: Diagnosis not present

## 2022-05-21 DIAGNOSIS — R102 Pelvic and perineal pain: Secondary | ICD-10-CM | POA: Insufficient documentation

## 2022-05-21 DIAGNOSIS — R140 Abdominal distension (gaseous): Secondary | ICD-10-CM | POA: Insufficient documentation

## 2022-05-21 DIAGNOSIS — E669 Obesity, unspecified: Secondary | ICD-10-CM | POA: Diagnosis not present

## 2022-05-21 DIAGNOSIS — Z8041 Family history of malignant neoplasm of ovary: Secondary | ICD-10-CM | POA: Insufficient documentation

## 2022-05-21 DIAGNOSIS — K432 Incisional hernia without obstruction or gangrene: Secondary | ICD-10-CM | POA: Diagnosis not present

## 2022-05-21 DIAGNOSIS — Z90710 Acquired absence of both cervix and uterus: Secondary | ICD-10-CM | POA: Diagnosis not present

## 2022-05-21 DIAGNOSIS — Z803 Family history of malignant neoplasm of breast: Secondary | ICD-10-CM | POA: Diagnosis not present

## 2022-05-21 DIAGNOSIS — Z809 Family history of malignant neoplasm, unspecified: Secondary | ICD-10-CM

## 2022-05-21 DIAGNOSIS — K8020 Calculus of gallbladder without cholecystitis without obstruction: Secondary | ICD-10-CM

## 2022-05-21 MED ORDER — OXYCODONE HCL 5 MG PO TABS: 5.0000 mg | ORAL_TABLET | ORAL | 0 refills | Status: DC | PRN

## 2022-05-21 NOTE — Patient Instructions (Addendum)
It was a pleasure to see you in clinic today. - Take tylenol '500mg'$  every 6 hours, ibuprofen '600mg'$  every 6 hours (alternate). The oxycodone as needed for breakthrough.  - Take miralax daily for any days you use oxycodone or zofran.  We will tentatively plan for surgery at [facility] with Dr. Bernadene Bell on July 10, 2022. We will see you back in the office closer to the date for a preop appointment with Joylene John NP to discuss the instructions for before and after surgery.  You may also receive a phone call from the hospital to arrange for a pre-op appointment there as well. Usually both appointments can be combined on the same day.   Thank you very much for allowing me to provide care for you today.  I appreciate your confidence in choosing our Gynecologic Oncology team at [facility].  If you have any questions about your visit today please call our office or send [place] a MyChart message and we will get back to you as soon as possible.

## 2022-05-21 NOTE — Progress Notes (Unsigned)
GYNECOLOGIC ONCOLOGY NEW PATIENT CONSULTATION  Date of Service: 05/21/2022 Referring Provider: Irene Pap, MD   ASSESSMENT AND PLAN: Erin Ray is a 41 y.o. woman with a 4 cm pelvic mass that likely represents a remnant fibroid.  Pelvic mass: We reviewed that the exact etiology of the pelvic mass is unclear, but could include a benign, borderline, or malignant process.  The recommended treatment is surgical excision to make a definitive diagnosis.  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 Because the mass is relatively small, we feel that a minimally invasive approach is feasible, using robotic assistance.   Reviewed that her pain may not be due to the small mass.  Will not know for sure if this surgery will treat this until after surgery.  Patient expresses strong desire for BSO due to pain.  Discussed that at her age I would not recommend this as first-line for pain.  Reviewed risks of premature menopause including cardiac morbidity and mortality and osteoporosis as well as the potential association with mental status later in life.  However, given patient's significant family history for cancer including breast and ovarian cancer, do recommend referral to genetic counselor for likely genetic testing.  If genetic testing were to demonstrate an abnormality related to breast and ovarian cancers, would discuss consideration for BSO at time of surgery.  Recommended tylenol, motrin alternating for pain control. Few tabs of oxycodone prescribed for breakthrough pain for now.  Family history of cancer: Recommend referral to genetic counselor as above.  Incisional hernia, cholelithiasis: Patient with upper abdominal pain and tenderness as well as pain associated with meals.  Small hernia on exam and cholelithiasis noted on CT scan.  Recommend referral to general surgery for evaluation.  If offering surgery, would be willing to coordinate  surgical timing for patient.  Preop counseling: Patient was verbally consented for: Robotic assisted pelvic mass excision. Will tentatively plan for January to allow for time for the above consultations.   The risks of surgery were discussed in detail and she understands these to including but not limited to bleeding requiring a blood transfusion, infection, injury to adjacent organs (including but not limited to the bowels, bladder, ureters, nerves, blood vessels), thromboembolic events, wound separation, hernia, unforseen complication, and possible need for re-exploration.  If the patient experiences any of these events, she understands that her hospitalization or recovery may be prolonged and that she may need to take additional medications for a prolonged period. The patient will receive DVT and antibiotic prophylaxis as indicated. She voiced a clear understanding. She had the opportunity to ask questions and written informed consent was obtained today. She wishes to proceed.  She does not require preoperative clearance. Her METs are >4.  All preoperative instructions were reviewed. Postoperative expectations were also reviewed. Patient will return for a preoperative visit with Joylene John, NP closer to time of surgery.    A copy of this note was sent to the patient's referring provider.  Bernadene Bell, MD Gynecologic Oncology   Medical Decision Making I personally spent  TOTAL 50 minutes face-to-face and non-face-to-face in the care of this patient, which includes all pre, intra, and post visit time on the date of service.  3 minutes spent reviewing records prior to the visit 40 Minutes in patient contact 7 minutes charting , conferring with consultants etc.   ------------  CC: Pelvic mass  HISTORY OF PRESENT ILLNESS:  Erin Ray is a 41 y.o. woman who  is seen in consultation at the request of Antonietta Jewel, MD for evaluation of a pelvic mass.  Pt was seen by her  Ob/Gyn for abdominal cramping. She underwent a pelvic ultrasound. Patient has a history of prior hysterectomy. On ultrasound she was noted to have a 4.5cm circular solid mass with leiomyomatous appearance adjacent to the left ovary. She then underwent a pelvic MRI on 05/10/22 which demonstrated a 4cm rounded mass in the central pelvis that may reflext an extruded or ectopic leiomyoma.  Today, patient reports that her main concern is cyclic abdominal pelvic pain.  She reports that along with this cyclic pain she also notes bloating and pain about 30 minutes after eating.  She associates this with possible ovulation.  She has been on Sprintec which she does not feel completely helps the pain.  She also notes some early satiety and longstanding constipation.  She otherwise denies significant weight loss or change in bladder habits.  Of note, patient has a significant family history of cancer including breast cancer in a paternal aunt, prostate cancer in a paternal uncle, breast/colon/ovarian cancer in a paternal grandmother and lung cancer in a paternal grandfather.  She also notes lung cancer in a maternal grandmother but reports this person was a longstanding smoker.  Patient denies any awareness of genetic testing within the family and she has not undergone genetic testing herself.   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    PAST SURGICAL HISTORY: Past Surgical History:  Procedure Laterality Date   BILATERAL SALPINGECTOMY Bilateral 10/09/2012   Procedure: BILATERAL SALPINGECTOMY;  Surgeon: Emily Filbert, MD;  Location: [facility];  Service: Gynecology;  Laterality: Bilateral;   BRAIN SURGERY  2011   hist of [place] , "brain swelling"  drained fluid   BRAIN  SURGERY     past treatment [place], will be followed here by [facility]   CYSTOSCOPY N/A 10/09/2012   Procedure: CYSTOSCOPY;  Surgeon: Emily Filbert, MD;  Location: [facility];  Service: Gynecology;  Laterality: N/A;   DILATION AND CURETTAGE OF UTERUS     ESOPHAGEAL DILATION  2011   MULTIPLE TOOTH EXTRACTIONS     ROBOTIC ASSISTED TOTAL HYSTERECTOMY N/A 10/09/2012   Procedure: ROBOTIC ASSISTED TOTAL HYSTERECTOMY;  Surgeon: Emily Filbert, MD;  Location: [facility];  Service: Gynecology;  Laterality: N/A;    OB/GYN HISTORY: OB History  Gravida Para Term Preterm AB Living  0 0 0 0 0 0  SAB IAB Ectopic Multiple Live Births  0 0 0 0        Age at menarche: 38 Age at menopause: Status post hysterectomy Hx of HRT: None Hx of STI: yes Last pap: 09/02/2019, within normal limits History of abnormal pap smears: Yes, colpo repeat Pap within normal limits  SCREENING STUDIES:  Last mammogram: 2023 Last colonoscopy: None  MEDICATIONS:  Current Outpatient Medications:    acetaminophen (TYLENOL) 500 MG tablet, Take 500-1,000 mg by mouth every 6 (six) hours as needed (for pain or headaches)., Disp: , Rfl:    calcium carbonate (TUMS - DOSED IN MG ELEMENTAL CALCIUM) 500 MG chewable tablet, Chew 1 tablet by mouth 2 (two) times daily as  needed for heartburn. , Disp: , Rfl:    cetirizine (ZYRTEC) 10 MG tablet, Take 10 mg by mouth daily., Disp: , Rfl:    diphenhydrAMINE (BENADRYL) 12.5 MG/5ML liquid, Take 25 mg by mouth 4 (four) times daily as needed for allergies (or cold symptoms). , Disp: , Rfl:    EPINEPHrine (EPIPEN 2-PAK) 0.3 mg/0.3 mL IJ SOAJ injection, Inject 0.3 mLs into the muscle once as needed for anaphylaxis., Disp: , Rfl:    fluticasone (FLONASE) 50 MCG/ACT nasal spray, Place 1 spray into both nostrils 2 (two) times daily as needed for allergies. , Disp: , Rfl:    guaifenesin (ROBITUSSIN) 100 MG/5ML syrup, Take 200 mg by mouth 3 (three) times daily as needed for cough., Disp: , Rfl:     norethindrone-ethinyl estradiol (MICROGESTIN,JUNEL,LOESTRIN) 1-20 MG-MCG tablet, Take 1 tablet by mouth daily., Disp: , Rfl:    oxyCODONE (OXY IR/ROXICODONE) 5 MG immediate release tablet, Take 1 tablet (5 mg total) by mouth every 4 (four) hours as needed for severe pain., Disp: 20 tablet, Rfl: 0   pseudoephedrine (SUDAFED) 120 MG 12 hr tablet, Take 120 mg by mouth 2 (two) times daily as needed for congestion. , Disp: , Rfl:    ondansetron (ZOFRAN-ODT) 4 MG disintegrating tablet, Take 1 tablet (4 mg total) by mouth every 8 (eight) hours as needed., Disp: 20 tablet, Rfl: 0   oxyCODONE (ROXICODONE) 5 MG immediate release tablet, Take 1 tablet (5 mg total) by mouth every 4 (four) hours as needed for severe pain., Disp: 12 tablet, Rfl: 0  ALLERGIES: Allergies  Allergen Reactions   Amoxicillin Hives   Cefuroxime Axetil Diarrhea    Per [facility]   Allopurinol [Acetaminophen] Rash   Latex Rash   Tape Rash    No tape containing latex!!    FAMILY HISTORY: Family History  Problem Relation Age of Onset   Hypertension Mother    Breast cancer Paternal Aunt    Prostate cancer Paternal Uncle    Lung cancer Maternal Grandmother    Breast cancer Paternal Grandmother    Colon cancer Paternal Grandmother    Ovarian cancer Paternal Grandmother    Lung cancer Paternal Grandfather    Endometrial cancer Neg Hx    Pancreatic cancer Neg Hx     SOCIAL HISTORY: Social History   Socioeconomic History   Marital status: Single    Spouse name: Not on file   Number of children: 0   Years of education: Not on file   Highest education level: Not on file  Occupational History   Not on file  Tobacco Use   Smoking status: Never   Smokeless tobacco: Never  Substance and Sexual Activity   Alcohol use: No   Drug use: Not Currently    Comment: stopped weed in 2006   Sexual activity: Not Currently    Birth control/protection: Surgical  Other Topics Concern   Not on file  Social History Narrative    Not on file   Social Determinants of Health   Financial Resource Strain: Not on file  Food Insecurity: Not on file  Transportation Needs: Not on file  Physical Activity: Not on file  Stress: Not on file  Social Connections: Not on file  Intimate Partner Violence: Not on file    REVIEW OF SYSTEMS: New patient intake form was reviewed.  Complete 10-system review is negative except for the following: Pain/nausea with eating at times, constipation, diarrhea, pelvic pain, back pain, abdominal pain, muscle pain/cramping  PHYSICAL EXAM:  BP 133/69 (BP Location: Left Arm, Patient Position: Sitting)   Pulse 74   Temp 98.4 F (36.9 C) (Oral)   Resp 16   Ht '5\' 2"'$  (1.575 m)   Wt 142 lb (64.4 kg)   LMP 08/13/2012   SpO2 99%   BMI 25.97 kg/m  Constitutional: No acute distress. Neuro/Psych: Alert, oriented.  Head and Neck: Normocephalic, atraumatic. Neck symmetric without masses. Sclera anicteric.  Respiratory: Normal work of breathing. Clear to auscultation bilaterally. Cardiovascular: Regular rate and rhythm, no murmurs, rubs, or gallops. Abdomen: Normoactive bowel sounds. Soft, non-distended.  Mild diffuse tenderness to palpation lower abdomen and in midline upper abdomen overlying the gastric region. No masses or hepatosplenomegaly appreciated.  Small incisional hernia palpated with cough at the midline supraumbilical incision.  No palpable fluid wave.  Well-healed laparoscopic incisions. Extremities: Grossly normal range of motion. Warm, well perfused. No edema bilaterally. Skin: No rashes or lesions. Lymphatic: No cervical, supraclavicular, or inguinal adenopathy. Genitourinary: External genitalia without lesions. Urethral meatus without lesions or prolapse. On speculum exam, vagina without lesions. Bimanual exam reveals smooth vaginal cuff and possible fullness above the vaginal cuff that is not discretely palpated.Rectovaginal exam confirms the above findings and reveals normal  sphincter tone and  no masses or nodularity. Exam chaperoned by Joylene John, NP   LABORATORY AND RADIOLOGIC DATA: Outside medical records were reviewed to synthesize the above history, along with the history and physical obtained during the visit.  Outside laboratory, and imaging reports were reviewed, with pertinent results below.  I personally reviewed the outside images.  WBC  Date Value Ref Range Status  05/19/2022 10.5 4.0 - 10.5 K/uL Final   Hemoglobin  Date Value Ref Range Status  05/19/2022 13.0 12.0 - 15.0 g/dL Final   HCT  Date Value Ref Range Status  05/19/2022 38.2 36.0 - 46.0 % Final   Platelets  Date Value Ref Range Status  05/19/2022 348 150 - 400 K/uL Final   Magnesium  Date Value Ref Range Status  12/28/2016 1.9 1.7 - 2.4 mg/dL Final   Creatinine, Ser  Date Value Ref Range Status  05/19/2022 0.64 0.44 - 1.00 mg/dL Final   AST  Date Value Ref Range Status  05/19/2022 16 15 - 41 U/L Final   ALT  Date Value Ref Range Status  05/19/2022 13 0 - 44 U/L Final    CT ABDOMEN PELVIS W CONTRAST 05/18/2022  Narrative CLINICAL DATA:  Acute, nonlocalized abdominal pain.  Pelvic mass.  EXAM: CT ABDOMEN AND PELVIS WITH CONTRAST  TECHNIQUE: Multidetector CT imaging of the abdomen and pelvis was performed using the standard protocol following bolus administration of intravenous contrast.  RADIATION DOSE REDUCTION: This exam was performed according to the departmental dose-optimization program which includes automated exposure control, adjustment of the mA and/or kV according to patient size and/or use of iterative reconstruction technique.  CONTRAST:  162m OMNIPAQUE IOHEXOL 300 MG/ML  SOLN  COMPARISON:  05/10/2022 pelvis MRI  FINDINGS: Lower chest:  No contributory findings.  Hepatobiliary: No focal liver abnormality.Cholelithiasis. No evidence of gallbladder inflammation. Mild intrahepatic biliary duct prominence  Pancreas:  Unremarkable.  Spleen: Unremarkable.  Adrenals/Urinary Tract: Negative adrenals. No hydronephrosis or stone. Cystic densities in the kidney measuring up to 3.3 cm on the left. No imaging follow-up recommended. Unremarkable bladder.  Stomach/Bowel:  No obstruction. No appendicitis.  Vascular/Lymphatic: No acute vascular abnormality. No mass or adenopathy.  Reproductive:Rounded central pelvic mass with avid enhancement/increased density measuring 4 cm in diameter. Just superiorly along the  sacral promontory is and adjacent more heterogeneous mass measuring 3.2 x 2 cm. These are unchanged from recent pelvis MRI. Hysterectomy. No lateral pelvic/adnexal masses.  Other: Fatty midline epigastric hernia. Small volume pelvic fluid also seen on prior MRI.  Musculoskeletal: No acute abnormalities.  IMPRESSION: 1. No acute finding. 2. Pelvic and presacral mass/es recently evaluated by pelvis MRI. 3. Small volume pelvic fluid also seen on prior MRI. 4. Cholelithiasis and midline fatty hernia.   Electronically Signed By: Jorje Guild M.D. On: 05/18/2022 04:15   MR PELVIS W WO CONTRAST 05/10/2022  Narrative CLINICAL DATA:  Fibroids, status post hysterectomy  EXAM: MRI PELVIS WITHOUT AND WITH CONTRAST  TECHNIQUE: Multiplanar multisequence MR imaging of the pelvis was performed both before and after administration of intravenous contrast.  CONTRAST:  6 mL Vueway gadolinium contrast IV  COMPARISON:  MR pelvis, 08/02/2012  FINDINGS: Urinary Tract:  No abnormality visualized.  Bowel:  Unremarkable visualized pelvic bowel loops.  Vascular/Lymphatic: No pathologically enlarged lymph nodes. No significant vascular abnormality seen.  Reproductive: Status post hysterectomy. There is a smoothly rounded, homogeneous enhancing mass in the central pelvis, just above the bladder dome, measuring 4.0 x 3.9 cm (series 16, image 40, series 18, image 41). Just superiorly, is an  additional, heterogeneously enhancing component measuring 3.5 x 2.1 cm (series 16, image 27, series 18, image 31). The ovaries are not clearly visualized on today's examination and may be surgically absent.  Other:  Small volume free fluid in the pelvis.  Musculoskeletal: No suspicious bone lesions identified.  IMPRESSION: 1. Status post hysterectomy. 2. Rounded, enhancing mass in the central pelvis, just above the bladder dome, measuring 4.0 x 3.9 cm. Just superiorly, is an additional, heterogeneously enhancing component measuring 3.5 x 2.1 cm. Given stated history of fibroids and interval hysterectomy, this may reflect an extruded or ectopic leiomyoma persisting within the mesentery after hysterectomy (i.e. "parasitic fibroid"). 3. The ovaries are not clearly visualized on today's examination and may be surgically absent. 4. Small volume nonspecific free fluid in the pelvis.   Electronically Signed By: Delanna Ahmadi M.D. On: 05/14/2022 07:12

## 2022-05-22 ENCOUNTER — Encounter: Payer: Self-pay | Admitting: Psychiatry

## 2022-05-22 ENCOUNTER — Telehealth: Payer: Self-pay | Admitting: *Deleted

## 2022-05-22 NOTE — Telephone Encounter (Signed)
Per Dr Ernestina Patches office notes and referral fax to [facility]

## 2022-05-23 ENCOUNTER — Encounter: Payer: Self-pay | Admitting: Genetic Counselor

## 2022-05-23 ENCOUNTER — Inpatient Hospital Stay: Payer: Medicare Other

## 2022-05-23 ENCOUNTER — Other Ambulatory Visit: Payer: Self-pay | Admitting: Genetic Counselor

## 2022-05-23 ENCOUNTER — Inpatient Hospital Stay (HOSPITAL_BASED_OUTPATIENT_CLINIC_OR_DEPARTMENT_OTHER): Payer: Medicare Other | Admitting: Genetic Counselor

## 2022-05-23 DIAGNOSIS — Z8042 Family history of malignant neoplasm of prostate: Secondary | ICD-10-CM | POA: Diagnosis not present

## 2022-05-23 DIAGNOSIS — Z803 Family history of malignant neoplasm of breast: Secondary | ICD-10-CM

## 2022-05-23 DIAGNOSIS — Z800 Family history of malignant neoplasm of digestive organs: Secondary | ICD-10-CM | POA: Diagnosis not present

## 2022-05-23 DIAGNOSIS — Z8041 Family history of malignant neoplasm of ovary: Secondary | ICD-10-CM

## 2022-05-23 LAB — GENETIC SCREENING ORDER

## 2022-05-23 NOTE — Progress Notes (Signed)
REFERRING PROVIDER: Bernadene Bell, MD [place],  [place] [address]  PRIMARY PROVIDER:  Ashley Akin, NP  PRIMARY REASON FOR VISIT:  1. Family history of breast cancer   2. Family history of colon cancer   3. Family history of ovarian cancer   4. Family history of prostate cancer      HISTORY OF PRESENT ILLNESS:   Erin Ray, a 41 y.o. female, was seen for a [facility] cancer genetics consultation at the request of Dr. Ernestina Patches due to a family history of breast, ovarian, colon and prostate cancer.  Erin Ray presents to clinic today to discuss the possibility of a hereditary predisposition to cancer, genetic testing, and to further clarify her future cancer risks, as well as potential cancer risks for family members.   Erin Ray is a 41 y.o. female with no personal history of cancer.  She has a pelvic mass that will be removed in January.  CANCER HISTORY:  Oncology History   No history exists.     RISK FACTORS:  Menarche was at age 55.  First live birth at age N/A.  OCP use for approximately 6 years.  Ovaries intact: yes.  Hysterectomy: yes.  Menopausal status: perimenopausal.  HRT use: 0 years. Colonoscopy: no; not examined. Mammogram within the last year: yes. Number of breast biopsies: 0. Up to date with pelvic exams: no. Any excessive radiation exposure in the past: no  Past Medical History:  Diagnosis Date   Anemia    prior to hysterectomy in 2014   Family history of breast cancer    Family history of colon cancer    Family history of ovarian cancer    Family history of prostate cancer    Fibroids    GERD (gastroesophageal reflux disease)    hx of esophogeal dilatation 2011   Obesity    Seizures ([place])    due to [place] fever meningitis 07/02/2009    Past Surgical History:  Procedure Laterality Date   BILATERAL SALPINGECTOMY Bilateral 10/09/2012   Procedure: BILATERAL SALPINGECTOMY;  Surgeon: Emily Filbert, MD;  Location: [facility];  Service: Gynecology;  Laterality: Bilateral;   BRAIN SURGERY  2011   hist of [place] , "brain swelling"  drained fluid   BRAIN SURGERY     past [place], [place] be [place] here by [facility]   CYSTOSCOPY N/A 10/09/2012   Procedure: CYSTOSCOPY;  Surgeon: Emily Filbert, MD;  Location: [facility];  Service: Gynecology;  Laterality: N/A;   DILATION AND CURETTAGE OF UTERUS     ESOPHAGEAL DILATION  2011   MULTIPLE TOOTH EXTRACTIONS     ROBOTIC ASSISTED TOTAL HYSTERECTOMY N/A 10/09/2012   Procedure: ROBOTIC ASSISTED TOTAL HYSTERECTOMY;  Surgeon: Emily Filbert, MD;  Location: [facility];  Service: Gynecology;  Laterality: N/A;    Social History   Socioeconomic History   Marital status: Single    Spouse name: Not on file   Number of children: 0   Years of education: Not on file   Highest education level: Not on file  Occupational History   Not on file  Tobacco Use   Smoking status: Never   Smokeless tobacco: Never  Substance and Sexual Activity   Alcohol use: No   Drug use: Not Currently    Comment: stopped weed in 2006   Sexual activity: Not Currently    Birth control/protection: Surgical  Other Topics Concern   Not on file  Social History Narrative   Not on file   Social Determinants of Health   Financial Resource Strain: Not on file  Food Insecurity: Not on file  Transportation Needs: Not on file  Physical Activity: Not on file  Stress: Not on file  Social Connections: Not on file     FAMILY HISTORY:  We obtained a detailed, 4-generation family history.  Significant diagnoses are listed below: Family History  Problem Relation Age of Onset   Hypertension Mother    Breast cancer Paternal Aunt        x2; first diagnosis under 75   Prostate cancer Paternal Uncle 59   Lung cancer Maternal Grandmother    Breast cancer Paternal Grandmother    Colon cancer Paternal Grandmother    Ovarian cancer Paternal Grandmother    Lung cancer Paternal Grandfather     Endometrial cancer Neg Hx    Pancreatic cancer Neg Hx       The patient does not have children.  She has a maternal half sister who is cancer free.  Both parents are living.    The patient's father has four brothers and five sisters.  One sister had breast cancer under age 28 and again in her 23's-60's, and one brother had prostate cancer and died in his 44's.  The paternal grandmother had breast, ovarian and colon cancer and died in her 85's-70's.  The grandfather was a smoker and had lung cancer.  The patient's mother is living and cancer free.  She has a maternal half brother and sister who are cancer free.  The grandmother had lung cancer.  [name] is unaware of previous family history of genetic testing for hereditary cancer risks. Patient's maternal ancestors are of African American descent, and paternal ancestors are of African American descent. There is no reported Ashkenazi Jewish ancestry. There is no known consanguinity.  GENETIC COUNSELING ASSESSMENT: Erin Ray is a 41 y.o. female with a family history of cancer which is somewhat suggestive of a hereditary cancer syndrome and predisposition to cancer given the combination of cancer, number of cancers in one individual and young ages of onset. We, therefore, discussed and recommended the following at today's visit.   DISCUSSION: We discussed that, in general, most cancer is not inherited in families, but instead is sporadic or familial. Sporadic cancers occur by chance and typically happen at older ages (>50 years) as this type of cancer is caused by genetic changes acquired during an individual's lifetime. Some families have more cancers than would be expected by chance; however, the ages or types of cancer are not consistent with a known genetic mutation or known genetic mutations have been ruled out. This type of familial cancer is thought to be due to a combination of multiple genetic, environmental, hormonal, and lifestyle factors.  While this combination of factors likely increases the risk of cancer, the exact source of this risk is not currently identifiable or testable.  We discussed that 5 - 10% of breast cancer is hereditary, with most cases associated with BRCA mutations.  There are other genes that can be associated with hereditary breast cancer syndromes.  These include ATM, CHEK2 and PALB2.  We discussed that testing is beneficial for several reasons including knowing how to follow individuals after completing their treatment, identifying whether potential treatment options such as PARP inhibitors would be beneficial, and understand if other family members could be at risk for cancer and allow them to undergo genetic testing.   We discussed that  some people do not want to undergo genetic testing due to fear of genetic discrimination.  A federal law called the Genetic Information Non-Discrimination Act (GINA) of 2008 helps protect individuals against genetic discrimination based on their genetic test results.  It impacts both health insurance and employment.  With health insurance, it protects against increased premiums, being kicked off insurance or being forced to take a test in order to be insured.  For employment it protects against hiring, firing and promoting decisions based on genetic test results.  GINA does not apply to those in the [employer], those who work for companies with less than 15 employees, and new life insurance or long-term disability insurance policies.  Health status due to a cancer diagnosis is not protected under GINA.   We reviewed the characteristics, features and inheritance patterns of hereditary cancer syndromes. We also discussed genetic testing, including the appropriate family members to test, the process of testing, insurance coverage and turn-around-time for results. We discussed the implications of a negative, positive, carrier and/or variant of uncertain significant result. Ms. Farina  was  offered a common hereditary cancer panel (47 genes) and an expanded pan-cancer panel (77 genes). Ms. Zuidema was informed of the benefits and limitations of each panel, including that expanded pan-cancer panels contain genes that do not have clear management guidelines at this point in time.  We also discussed that as the number of genes included on a panel increases, the chances of variants of uncertain significance increases. Ms. Flaum decided to pursue genetic testing for the CancerNext-Expanded+RNAinsight gene panel.   The CancerNext-Expanded gene panel offered by [facility] and includes sequencing and rearrangement analysis for the following 77 genes: AIP, ALK, APC*, ATM*, AXIN2, BAP1, BARD1, BLM, BMPR1A, BRCA1*, BRCA2*, BRIP1*, CDC73, CDH1*, CDK4, CDKN1B, CDKN2A, CHEK2*, CTNNA1, DICER1, FANCC, FH, FLCN, GALNT12, KIF1B, LZTR1, MAX, MEN1, MET, MLH1*, MSH2*, MSH3, MSH6*, MUTYH*, NBN, NF1*, NF2, NTHL1, PALB2*, PHOX2B, PMS2*, POT1, PRKAR1A, PTCH1, PTEN*, RAD51C*, RAD51D*, RB1, RECQL, RET, SDHA, SDHAF2, SDHB, SDHC, SDHD, SMAD4, SMARCA4, SMARCB1, SMARCE1, STK11, SUFU, TMEM127, TP53*, TSC1, TSC2, VHL and XRCC2 (sequencing and deletion/duplication); EGFR, EGLN1, HOXB13, KIT, MITF, PDGFRA, POLD1, and POLE (sequencing only); EPCAM and GREM1 (deletion/duplication only). DNA and RNA analyses performed for * genes.   Based on Ms. Hase family history of cancer, she meets medical criteria for genetic testing. Despite that she meets criteria, she may still have an out of pocket cost. We discussed that if her out of pocket cost for testing is over $100, the laboratory will call and confirm whether she wants to proceed with testing.  If the out of pocket cost of testing is less than $100 she will be billed by the genetic testing laboratory.   PLAN: After considering the risks, benefits, and limitations, Ms. Carmicheal provided informed consent to pursue genetic testing and the blood sample was sent to [facility] for analysis of the CancerNext-Expanded+RNAinsight. Results should be available within approximately 2-3 weeks' time, at which point they will be disclosed by telephone to Ms. Staff, as will any additional recommendations warranted by these results. Ms. Mckinny will receive a summary of her genetic counseling visit and a copy of her results once available. This information will also be available in Epic.   Lastly, we encouraged Ms. Pirie to remain in contact with cancer genetics annually so that we can continuously update the family history and inform her of any changes in cancer genetics and testing that may be of benefit for this family.  Ms. Bazin questions were answered to her satisfaction today. Our contact information was provided should additional questions or concerns arise. Thank you for the referral and allowing [place] to share in the care of your patient.   Alleyne Lac P. Florene Glen, [place], [facility] Licensed, Insurance risk surveyor [email or web address] phone: [phone]  The patient was seen for a total of 30 minutes in face-to-face genetic counseling.  The patient was seen alone.  Drs. Michell Heinrich, and/or [facility] were available for questions, if needed..    _______________________________________________________________________ For Office Staff:  Number of people involved in session: 1 Was an Intern/ student involved with case: no

## 2022-05-29 ENCOUNTER — Encounter: Payer: Self-pay | Admitting: Psychiatry

## 2022-06-12 ENCOUNTER — Encounter: Payer: Self-pay | Admitting: Nurse Practitioner

## 2022-06-12 ENCOUNTER — Ambulatory Visit (INDEPENDENT_AMBULATORY_CARE_PROVIDER_SITE_OTHER): Payer: Medicare Other | Admitting: Nurse Practitioner

## 2022-06-12 ENCOUNTER — Other Ambulatory Visit (INDEPENDENT_AMBULATORY_CARE_PROVIDER_SITE_OTHER): Payer: Medicare Other

## 2022-06-12 VITALS — BP 116/80 | HR 70 | Ht 62.0 in | Wt 144.5 lb

## 2022-06-12 DIAGNOSIS — R935 Abnormal findings on diagnostic imaging of other abdominal regions, including retroperitoneum: Secondary | ICD-10-CM

## 2022-06-12 DIAGNOSIS — R1011 Right upper quadrant pain: Secondary | ICD-10-CM

## 2022-06-12 DIAGNOSIS — K8020 Calculus of gallbladder without cholecystitis without obstruction: Secondary | ICD-10-CM

## 2022-06-12 DIAGNOSIS — R110 Nausea: Secondary | ICD-10-CM

## 2022-06-12 LAB — CBC WITH DIFFERENTIAL/PLATELET
Basophils Absolute: 0 10*3/uL (ref 0.0–0.1)
Basophils Relative: 0.7 % (ref 0.0–3.0)
Eosinophils Absolute: 0.1 10*3/uL (ref 0.0–0.7)
Eosinophils Relative: 2 % (ref 0.0–5.0)
HCT: 35.3 % — ABNORMAL LOW (ref 36.0–46.0)
Hemoglobin: 11.9 g/dL — ABNORMAL LOW (ref 12.0–15.0)
Lymphocytes Relative: 43.9 % (ref 12.0–46.0)
Lymphs Abs: 3 10*3/uL (ref 0.7–4.0)
MCHC: 33.7 g/dL (ref 30.0–36.0)
MCV: 85.3 fl (ref 78.0–100.0)
Monocytes Absolute: 0.4 10*3/uL (ref 0.1–1.0)
Monocytes Relative: 6.3 % (ref 3.0–12.0)
Neutro Abs: 3.3 10*3/uL (ref 1.4–7.7)
Neutrophils Relative %: 47.1 % (ref 43.0–77.0)
Platelets: 497 10*3/uL — ABNORMAL HIGH (ref 150.0–400.0)
RBC: 4.14 Mil/uL (ref 3.87–5.11)
RDW: 13.6 % (ref 11.5–15.5)
WBC: 6.9 10*3/uL (ref 4.0–10.5)

## 2022-06-12 LAB — COMPREHENSIVE METABOLIC PANEL
ALT: 10 U/L (ref 0–35)
AST: 13 U/L (ref 0–37)
Albumin: 4.5 g/dL (ref 3.5–5.2)
Alkaline Phosphatase: 50 U/L (ref 39–117)
BUN: 11 mg/dL (ref 6–23)
CO2: 28 mEq/L (ref 19–32)
Calcium: 9.8 mg/dL (ref 8.4–10.5)
Chloride: 101 mEq/L (ref 96–112)
Creatinine, Ser: 0.66 mg/dL (ref 0.40–1.20)
GFR: 108.93 mL/min (ref >60.00)
Glucose, Bld: 85 mg/dL (ref 70–99)
Potassium: 3.5 mEq/L (ref 3.5–5.1)
Sodium: 138 mEq/L (ref 135–145)
Total Bilirubin: 0.4 mg/dL (ref 0.2–1.2)
Total Protein: 7.8 g/dL (ref 6.0–8.3)

## 2022-06-12 MED ORDER — DICYCLOMINE HCL 10 MG PO CAPS: 10.0000 mg | ORAL_CAPSULE | Freq: Three times a day (TID) | ORAL | 0 refills | Status: DC | PRN

## 2022-06-12 NOTE — Patient Instructions (Signed)
_______________________________________________________  If you are age 41 or older, your body mass index should be between 23-30. Your Body mass index is 26.43 kg/m. If this is out of the aforementioned range listed, please consider follow up with your Primary Care Provider.  If you are age 28 or younger, your body mass index should be between 19-25. Your Body mass index is 26.43 kg/m. If this is out of the aformentioned range listed, please consider follow up with your Primary Care Provider.   ________________________________________________________  The  GI providers would like to encourage you to use [facility] to communicate with providers for non-urgent requests or questions.  Due to long hold times on the telephone, sending your provider a message by [facility] may be a faster and more efficient way to get a response.  Please allow 48 business hours for a response.  Please remember that this is for non-urgent requests.  _______________________________________________________  Your provider has requested that you go to the basement level for lab work before leaving today. Press "B" on the elevator. The lab is located at the first door on the left as you exit the elevator.  We have sent the following medications to your pharmacy for you to pick up at your convenience:  [place] will be contacted by [facility] to schedule an appointment.  You will be contacted by [facility] in the next 2 days to arrange an MRI/MRCP.  The number on your caller ID will be [phone], please answer when they call.  If you have not heard from them in 2 days please call [phone] to schedule.     You may take Pepcid '20mg'$  over the counter daily.  No Ibuprofen

## 2022-06-12 NOTE — Progress Notes (Unsigned)
06/12/2022 Erin Ray [id number] 07/24/1980   CHIEF COMPLAINT: Hernia and gallbladder problems   HISTORY OF PRESENT ILLNESS: Erin Ray with a past medical history of anemia, seizures secondary to [place] virus 2011, gallstones and GERD. Past hysterectomy secondary to uterine fibroids. She presents to our office today as referred by Ashley Akin NP for further evaluation regarding an upper abdominal hernia and gallstones. He has recurrent intermittent RUQ pain which sometimes radiates to he mid upper back and initially started in 2014. Her RUQ pain is sometimes triggered by eating and sometimes triggered by change of position ie: when picking up something from the floor.  No nausea or vomiting.  Normal LFTs and lipase level.  She has heartburn once every week or two for which she takes TUMS as needed. No dysphagia. She underwent an EGD in 2016 by Dr. 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 She is passing a formed brown stool twice weekly. No rectal bleeding. She does not take any fiber supplements or laxatives. Never had a colonoscopy. She takes Ibuprofen 44m Q 6 hours daily for back and abdominal pain.   She underwent a pelvic MRI 05/10/2022 due to having pelvic pain which identified a 4 x 3.9 mass to the central pelvis above the bladder dome and just superiorly an additional mass measuring 3.5 x 2.1 cm  with a small volume of pelvic ascites. CTAP 05/18/2022 due to having worsening abdominal pain which identified a pelvic and presacral mass, gallstones with mild intrahepatic biliary duct prominence and a midline fatty hernia.  She is scheduled for surgery to move and biopsy these masses on 07/10/2022.  Paternal grandmother history of ovarian cancer and she was diagnosed with colon cancer in her late 621sor early 745s  She questions if she can have hernia, gallbladder and pelvic surgery at one time.   Labs 05/19/2022: WBC  10.5.  Hemoglobin 13.  Hematocrit 38.2.  Platelet 348.  Sodium 134.  Potassium 3.2.  BUN less than 5.  Creatinine 0.64.  Alk phos 36.  Total bili 0.8.  AST 16.  ALT 13.  Lipase 32.  EGD 06/23/2015 by Dr. GCarlean Purl  ENDOSCOPIC IMPRESSION: 1) Stricture appearance at GE junction but no effect w/ balloon dilation18,19,20 mm so wide open and not a cause for dysphagia 2) Punctate red spots antrum - ? gastritis - biopsied 3) Otherwise normal EGD - no signs of eosinophilic esophagitis   Past Medical History:  Diagnosis Date   Anemia    prior to hysterectomy in 2014   Family history of breast cancer    Family history of colon cancer    Family history of ovarian cancer    Family history of prostate cancer    Fibroids    GERD (gastroesophageal reflux disease)    hx of esophogeal dilatation 2011   Obesity    [place] 07[place]   Past Surgical History:  Procedure Laterality Date   BILATERAL SALPINGECTOMY Bilateral 10/09/2012   Procedure: BILATERAL SALPINGECTOMY;  Surgeon: MEmily Filbert MD;  [facility];  Service: Gynecology;  Laterality: Bilateral;   BRAIN SURGERY  2011   [place] "brain swelling"  drained fluid   BRAIN SURGERY     past [place] be [facility] here by [facility]   CYSTOSCOPY N/A 10/09/2012   Procedure:  CYSTOSCOPY;  Surgeon: Emily Filbert, MD;  Location: [facility];  Service: Gynecology;  Laterality: N/A;   DILATION AND CURETTAGE OF UTERUS     ESOPHAGEAL DILATION  2011   MULTIPLE TOOTH EXTRACTIONS     ROBOTIC ASSISTED TOTAL HYSTERECTOMY N/A 10/09/2012   Procedure: ROBOTIC ASSISTED TOTAL HYSTERECTOMY;  Surgeon: Emily Filbert, MD;  Location: [facility];  Service: Gynecology;  Laterality: N/A;   Social History: She is single.  She is currently unemployed on disability.  Non-smoker.  No alcohol use.  No drug use.  Family History: family history includes Breast cancer in her paternal aunt  and paternal grandmother; Colon cancer in her paternal grandmother; Hypertension in her mother; Lung cancer in her maternal grandmother and paternal grandfather; Ovarian cancer in her paternal grandmother; Prostate cancer (age of onset: 74) in her paternal uncle.  Allergies  Allergen Reactions   Amoxicillin Hives   Cefuroxime Axetil Diarrhea    Per [facility]   Allopurinol [Acetaminophen] Rash   Latex Rash   Tape Rash    No tape containing latex!!      Outpatient Encounter Medications as of 06/12/2022  Medication Sig   acetaminophen (TYLENOL) 500 MG tablet Take 500-1,000 mg by mouth every 6 (six) hours as needed (for pain or headaches).   calcium carbonate (TUMS - DOSED IN MG ELEMENTAL CALCIUM) 500 MG chewable tablet Chew 1 tablet by mouth 2 (two) times daily as needed for heartburn.    cetirizine (ZYRTEC) 10 MG tablet Take 10 mg by mouth daily.   diphenhydrAMINE (BENADRYL) 12.5 MG/5ML liquid Take 25 mg by mouth 4 (four) times daily as needed for allergies (or cold symptoms).    EPINEPHrine (EPIPEN 2-PAK) 0.3 mg/0.3 mL IJ SOAJ injection Inject 0.3 mLs into the muscle once as needed for anaphylaxis.   fluticasone (FLONASE) 50 MCG/ACT nasal spray Place 1 spray into both nostrils 2 (two) times daily as needed for allergies.    guaifenesin (ROBITUSSIN) 100 MG/5ML syrup Take 200 mg by mouth 3 (three) times daily as needed for cough.   norethindrone-ethinyl estradiol (MICROGESTIN,JUNEL,LOESTRIN) 1-20 MG-MCG tablet Take 1 tablet by mouth daily.   ondansetron (ZOFRAN-ODT) 4 MG disintegrating tablet Take 1 tablet (4 mg total) by mouth every 8 (eight) hours as needed.   oxyCODONE (OXY IR/ROXICODONE) 5 MG immediate release tablet Take 1 tablet (5 mg total) by mouth every 4 (four) hours as needed for severe pain.   oxyCODONE (ROXICODONE) 5 MG immediate release tablet Take 1 tablet (5 mg total) by mouth every 4 (four) hours as needed for severe pain.   pseudoephedrine (SUDAFED) 120 MG 12 hr tablet  Take 120 mg by mouth 2 (two) times daily as needed for congestion.    No facility-administered encounter medications on file as of 06/12/2022.    REVIEW OF SYSTEMS:  Gen: Denies fever, sweats or chills. No weight loss.  CV: Denies chest pain, palpitations or edema. Resp: Denies cough, shortness of breath of hemoptysis.  GI: See HPI.  GU : Denies urinary burning, blood in urine, increased urinary frequency or incontinence. MS:+ Mid back pain and muscle cramps.  Derm: Denies rash, itchiness, skin lesions or unhealing ulcers. Psych: Denies depression, anxiety or memory loss.  Heme: Denies bruising, easy bleeding. Neuro:  Denies headaches, dizziness or paresthesias. Endo:  Denies any problems with DM, thyroid or adrenal function.  PHYSICAL EXAM: BP 116/80   Pulse 70   Ht _0  (1.575 m)   Wt 144 lb 8 oz (65.5 kg)  LMP 08/13/2012   BMI 26.43 kg/m   General: 41 year old female in no acute distress. Head: Normocephalic and atraumatic. Eyes:  Sclerae non-icteric, conjunctive pink. Ears: Normal auditory acuity. Mouth: Dentition intact. No ulcers or lesions.  Neck: Supple, no lymphadenopathy or thyromegaly.  Lungs: Clear bilaterally to auscultation without wheezes, crackles or rhonchi. Heart: Regular rate and rhythm. No murmur, rub or gallop appreciated.  Abdomen: Soft, nontender. Mild epigastric and RUQ tenderness without rebound or guarding. No masses. No hepatosplenomegaly. Normoactive bowel sounds x 4 quadrants.  Small abdominal hernia protrudes from the epigastrium.  Laparoscopic scars intact. Rectal: Deferred. Musculoskeletal: Symmetrical with no gross deformities. Skin: Warm and dry. No rash or lesions on visible extremities. Extremities: No edema. Neurological: Alert oriented x 4, no focal deficits.  Psychological:  Alert and cooperative. Normal mood and affect.  ASSESSMENT AND PLAN:  50) 41 year old female with chronic RUQ pain which radiated to the upper back without N/V.  Gallstones with mild intrahepatic biliary ductal prominence identified per CTAP 05/18/2022.  -CBC, CMP -Abdominal MRI/MRCP w/wo contrast to further evaluate the biliary tract and gallbladder -Consider future EGD, await the above lab and MRI results  -No Ibuprofen or other NSAIDs -Pepcid 78m one po QD OTC -General surgery referral, patient to discuss possible elective cholecystectomy and hernia surgery  2) Upper abdominal wall hernia -Refer to general surgery   3) GERD, remote history of dysphagia/esophageal stricture per EGD in 2016 which was dilated   4) Past hysterectomy secondary to uterine fibroids with 2 pelvic masses. Scheduled for laparoscopic surgery to further evaluate pelvic masses 07/10/2022 -I explained to the patient that it is unlikely that the general surgeon would complete a possible cholecystectomy and hernia repair at the time of her gyn surgery.  5) Hypokalemia. K+ 3.2. -Labs as ordered above    CC:  HAshley Akin NP

## 2022-06-18 ENCOUNTER — Telehealth: Payer: Self-pay | Admitting: Genetic Counselor

## 2022-06-18 ENCOUNTER — Ambulatory Visit: Payer: Self-pay | Admitting: Genetic Counselor

## 2022-06-18 DIAGNOSIS — Z1379 Encounter for other screening for genetic and chromosomal anomalies: Secondary | ICD-10-CM | POA: Insufficient documentation

## 2022-06-18 NOTE — Telephone Encounter (Signed)
Revealed negative genetic testing.  Discussed that we do not know why she has a pelvic mass or why there is cancer in the family. It could be due to a different gene that we are not testing, or maybe our current technology may not be able to pick something up.  It will be important for her to keep in contact with genetics to keep up with whether additional testing may be needed.

## 2022-06-18 NOTE — Telephone Encounter (Signed)
LM on VM that results were back and to please call. ?

## 2022-06-18 NOTE — Progress Notes (Signed)
HPI:  Erin Ray was previously seen in the [place] clinic due to a family history of cancer and personal history of a pelvic mass and concerns regarding a hereditary predisposition to cancer. Please refer to our prior cancer genetics clinic note for more information regarding our discussion, assessment and recommendations, at the time. Erin Ray recent genetic test results were disclosed to her, as were recommendations warranted by these results. These results and recommendations are discussed in more detail below.  CANCER HISTORY:  Oncology History   No history exists.    FAMILY HISTORY:  We obtained a detailed, 4-generation family history.  Significant diagnoses are listed below: Family History  Problem Relation Age of Onset   Hypertension Mother    Breast cancer Paternal Aunt        x2; first diagnosis under 39   Prostate cancer Paternal Uncle 85   Lung cancer Maternal Grandmother    Breast cancer Paternal Grandmother    Colon cancer Paternal Grandmother    Ovarian cancer Paternal Grandmother    Lung cancer Paternal Grandfather    Endometrial cancer Neg Hx    Pancreatic cancer Neg Hx        The patient does not have children.  She has a maternal half sister who is cancer free.  Both parents are living.     The patient's father has four brothers and five sisters.  One sister had breast cancer under age 70 and again in her 47's-60's, and one brother had prostate cancer and died in his 103's.  The paternal grandmother had breast, ovarian and colon cancer and died in her 59's-70's.  The grandfather was a smoker and had lung cancer.   The patient's mother is living and cancer free.  She has a maternal half brother and sister who are cancer free.  The grandmother had lung cancer.   Erin Ray is unaware of previous family history of genetic testing for hereditary cancer risks. Patient's maternal ancestors are of African American descent, and paternal ancestors are of  African American descent. There is no reported Ashkenazi Jewish ancestry. There is no known consanguinity.  GENETIC TEST RESULTS: Genetic testing reported out on June 16, 2022 through the CancerNext-Expanded+RNAinsight cancer panel found no pathogenic mutations. The CancerNext-Expanded gene panel offered by [facility] and includes sequencing and rearrangement analysis for the following 77 genes: AIP, ALK, APC*, ATM*, AXIN2, BAP1, BARD1, BLM, BMPR1A, BRCA1*, BRCA2*, BRIP1*, CDC73, CDH1*, CDK4, CDKN1B, CDKN2A, CHEK2*, CTNNA1, DICER1, FANCC, FH, FLCN, GALNT12, KIF1B, LZTR1, MAX, MEN1, MET, MLH1*, MSH2*, MSH3, MSH6*, MUTYH*, NBN, NF1*, NF2, NTHL1, PALB2*, PHOX2B, PMS2*, POT1, PRKAR1A, PTCH1, PTEN*, RAD51C*, RAD51D*, RB1, RECQL, RET, SDHA, SDHAF2, SDHB, SDHC, SDHD, SMAD4, SMARCA4, SMARCB1, SMARCE1, STK11, SUFU, TMEM127, TP53*, TSC1, TSC2, VHL and XRCC2 (sequencing and deletion/duplication); EGFR, EGLN1, HOXB13, KIT, MITF, PDGFRA, POLD1, and POLE (sequencing only); EPCAM and GREM1 (deletion/duplication only). DNA and RNA analyses performed for * genes. The test report has been scanned into EPIC and is located under the Molecular Pathology section of the Results Review tab.  A portion of the result report is included below for reference.     We discussed with Erin Ray that because current genetic testing is not perfect, it is possible there may be a gene mutation in one of these genes that current testing cannot detect, but that chance is small.  We also discussed, that there could be another gene that has not yet been discovered, or that we have not yet tested, that  is responsible for the cancer diagnoses in the family. It is also possible there is a hereditary cause for the cancer in the family that Erin Ray did not inherit and therefore was not identified in her testing.  Therefore, it is important to remain in touch with cancer genetics in the future so that we can continue to offer Erin Ray the most up  to date genetic testing.   ADDITIONAL GENETIC TESTING: We discussed with Erin Ray that her genetic testing was fairly extensive.  If there are genes identified to increase cancer risk that can be analyzed in the future, we would be happy to discuss and coordinate this testing at that time.    CANCER SCREENING RECOMMENDATIONS: Erin Ray test result is considered negative (normal).  This means that we have not identified a hereditary cause for her family history of cancer and personal history of a pelvic mass at this time. Most cancers happen by chance and this negative test suggests that her cancer may fall into this category.    While reassuring, this does not definitively rule out a hereditary predisposition to cancer. It is still possible that there could be genetic mutations that are undetectable by current technology. There could be genetic mutations in genes that have not been tested or identified to increase cancer risk.  Therefore, it is recommended she continue to follow the cancer management and screening guidelines provided by her oncology and primary healthcare provider.   An individual's cancer risk and medical management are not determined by genetic test results alone. Overall cancer risk assessment incorporates additional factors, including personal medical history, family history, and any available genetic information that may result in a personalized plan for cancer prevention and surveillance  RECOMMENDATIONS FOR FAMILY MEMBERS:  Individuals in this family might be at some increased risk of developing cancer, over the general population risk, simply due to the family history of cancer.  We recommended women in this family have a yearly mammogram beginning at age 96, or 45 years younger than the earliest onset of cancer, an annual clinical breast exam, and perform monthly breast self-exams. Women in this family should also have a gynecological exam as recommended by their primary  provider. All family members should be referred for colonoscopy starting at age 25.  FOLLOW-UP: Lastly, we discussed with Erin Ray that cancer genetics is a rapidly advancing field and it is possible that new genetic tests will be appropriate for her and/or her family members in the future. We encouraged her to remain in contact with cancer genetics on an annual basis so we can update her personal and family histories and let her know of advances in cancer genetics that may benefit this family.   Our contact number was provided. Erin Ray questions were answered to her satisfaction, and she knows she is welcome to call us at anytime with additional questions or concerns.   Roma Kayser, [place], [place] Licensed, Certified Genetic Counselor [email or web address]

## 2022-06-19 ENCOUNTER — Other Ambulatory Visit: Payer: Self-pay

## 2022-06-19 DIAGNOSIS — D649 Anemia, unspecified: Secondary | ICD-10-CM

## 2022-06-29 ENCOUNTER — Ambulatory Visit (HOSPITAL_COMMUNITY): Payer: Medicare Other

## 2022-07-03 ENCOUNTER — Telehealth: Payer: Self-pay

## 2022-07-03 NOTE — Telephone Encounter (Signed)
FW: MRI? Received: 3 days ago Noralyn Pick, NP  Gillermina Hu, RN Cc: Gatha Mayer, MD Remo Lipps, please cancel abd MRI/MRCP order as patient general surgery and they are planning on doing a cholecystectomy.  Thanks       Previous Messages    ----- Message ----- From: Gatha Mayer, MD Sent: 06/29/2022   6:00 PM EST To: Noralyn Pick, NP Subject: MRI?                                          Looks like [facility] going to do lap chole so suspect can forgo MRCP  Glendell Docker

## 2022-07-03 NOTE — Telephone Encounter (Signed)
Chart reviewed. Pt previously scheduled for 06/29/2022 at 7:00 PM MRI has previously been canceled:

## 2022-07-04 ENCOUNTER — Encounter: Payer: Self-pay | Admitting: Gynecologic Oncology

## 2022-07-06 ENCOUNTER — Inpatient Hospital Stay: Payer: Medicare Other | Admitting: Gynecologic Oncology

## 2022-07-06 ENCOUNTER — Other Ambulatory Visit: Payer: Medicare Other

## 2022-07-06 DIAGNOSIS — R1900 Intra-abdominal and pelvic swelling, mass and lump, unspecified site: Secondary | ICD-10-CM

## 2022-07-09 ENCOUNTER — Telehealth: Payer: Self-pay | Admitting: Gynecologic Oncology

## 2022-07-09 ENCOUNTER — Telehealth: Payer: Self-pay | Admitting: Oncology

## 2022-07-09 NOTE — Telephone Encounter (Signed)
Left a message that our office is trying to contact Dr. Sylvie Farrier office about scheduling a joint surgery.  We will keep her posted on what we find out.  Advised to call back with any questions.

## 2022-07-09 NOTE — Telephone Encounter (Signed)
Called [facility] to arrange a joint procedure. The surgery scheduler states she needs to look into this and will reach back out to our office.

## 2022-07-09 NOTE — Telephone Encounter (Signed)
Called office for Dr. Kieth Brightly to work on arranging joint surgery. Left message for Tammy with surgery scheduling.

## 2022-07-11 ENCOUNTER — Telehealth: Payer: Self-pay | Admitting: Gynecologic Oncology

## 2022-07-11 NOTE — Telephone Encounter (Signed)
Received phone call from [place], surgery scheduler with Dr. Kieth Brightly. She is inquiring about who would go first during the case. She states she will call back when ready to arrange joint procedure.

## 2022-07-13 ENCOUNTER — Telehealth: Payer: Self-pay | Admitting: Oncology

## 2022-07-13 NOTE — Telephone Encounter (Signed)
Left a message for Caryl Pina, Surgery scheduled at [place].  Requested an update on joint surgery.

## 2022-07-17 ENCOUNTER — Telehealth: Payer: Self-pay

## 2022-07-17 ENCOUNTER — Other Ambulatory Visit: Payer: Self-pay | Admitting: Gynecologic Oncology

## 2022-07-17 DIAGNOSIS — R1900 Intra-abdominal and pelvic swelling, mass and lump, unspecified site: Secondary | ICD-10-CM

## 2022-07-17 NOTE — Telephone Encounter (Signed)
Per Joylene John NP, Pt is aware   based on Dr. Amie Portland availability, the joint surgery will be on Feb 20 at [facility]. She is a joint procedure with Dr. Ernestina Patches and [facility]. She will receive a phone call from the hospital closer to the date to arrange a preop and she will need a preop with me closer as well. Thank you  Pt is scheduled on 08/14/22 for pre-op with Joylene John, NP. Pt agreed to all dates/times

## 2022-07-20 ENCOUNTER — Telehealth: Payer: Self-pay | Admitting: *Deleted

## 2022-07-20 NOTE — Telephone Encounter (Signed)
Per Dr Ernestina Patches, moved her pre op and pre admission appts to the same day that Dr Ernestina Patches is in the office. Patient aware

## 2022-07-23 ENCOUNTER — Encounter: Payer: Medicare Other | Admitting: Psychiatry

## 2022-07-23 ENCOUNTER — Other Ambulatory Visit (INDEPENDENT_AMBULATORY_CARE_PROVIDER_SITE_OTHER): Payer: Medicare Other

## 2022-07-23 DIAGNOSIS — D649 Anemia, unspecified: Secondary | ICD-10-CM | POA: Diagnosis not present

## 2022-07-23 LAB — IBC + FERRITIN
Ferritin: 73.9 ng/mL (ref 10.0–291.0)
Iron: 85 ug/dL (ref 42–145)
Saturation Ratios: 22.6 % (ref 20.0–50.0)
TIBC: 376.6 ug/dL (ref 250.0–450.0)
Transferrin: 269 mg/dL (ref 212.0–360.0)

## 2022-07-23 LAB — CBC WITH DIFFERENTIAL/PLATELET
Basophils Absolute: 0 10*3/uL (ref 0.0–0.1)
Basophils Relative: 0.6 % (ref 0.0–3.0)
Eosinophils Absolute: 0.1 10*3/uL (ref 0.0–0.7)
Eosinophils Relative: 2.2 % (ref 0.0–5.0)
HCT: 39.2 % (ref 36.0–46.0)
Hemoglobin: 13.3 g/dL (ref 12.0–15.0)
Lymphocytes Relative: 62.3 % — ABNORMAL HIGH (ref 12.0–46.0)
Lymphs Abs: 2.9 10*3/uL (ref 0.7–4.0)
MCHC: 34.1 g/dL (ref 30.0–36.0)
MCV: 84.8 fl (ref 78.0–100.0)
Monocytes Absolute: 0.4 10*3/uL (ref 0.1–1.0)
Monocytes Relative: 9.1 % (ref 3.0–12.0)
Neutro Abs: 1.2 10*3/uL — ABNORMAL LOW (ref 1.4–7.7)
Neutrophils Relative %: 25.8 % — ABNORMAL LOW (ref 43.0–77.0)
Platelets: 373 10*3/uL (ref 150.0–400.0)
RBC: 4.62 Mil/uL (ref 3.87–5.11)
RDW: 13.5 % (ref 11.5–15.5)
WBC: 4.6 10*3/uL (ref 4.0–10.5)

## 2022-07-23 LAB — VITAMIN B12: Vitamin B-12: 618 pg/mL (ref 211–911)

## 2022-08-01 ENCOUNTER — Other Ambulatory Visit: Payer: Self-pay

## 2022-08-01 ENCOUNTER — Telehealth: Payer: Self-pay | Admitting: Hematology and Oncology

## 2022-08-01 DIAGNOSIS — D649 Anemia, unspecified: Secondary | ICD-10-CM

## 2022-08-01 DIAGNOSIS — D709 Neutropenia, unspecified: Secondary | ICD-10-CM

## 2022-08-01 NOTE — Telephone Encounter (Signed)
Scheduled appt per 1/31 referral. Pt is aware of appt date and time. Pt is aware to arrive 15 mins prior to appt time and to bring and updated insurance card. Pt is aware of appt location.

## 2022-08-10 NOTE — Progress Notes (Signed)
SURGICAL WAITING ROOM VISITATION  Patients having surgery or a procedure may have no more than 2 support people in the waiting area - these visitors may rotate.    Children under the age of 29 must have an adult with them who is not the patient.  Due to an increase in RSV and influenza rates and associated hospitalizations, children ages 74 and under may not visit patients in [place].  If the patient needs to stay at the hospital during part of their recovery, the visitor guidelines for inpatient rooms apply. Pre-op nurse will coordinate an appropriate time for 1 support person to accompany patient in pre-op.  This support person may not rotate.    Please refer to the [facility] website for the visitor guidelines for Inpatients (after your surgery is over and you are in a regular room).       Your procedure is scheduled on:  08/21/2022    Report to [facility] Main Entrance    Report to admitting at  [place] AM   Call this number if you have problems the morning of surgery [phone]   Do not eat food :After Midnight.               After Midnight you may have the following liquids until __ 0430____ AM/   DAY OF SURGERY  Water Non-Citrus Juices (without pulp, NO RED-Apple, White grape, White cranberry) Black Coffee (NO MILK/CREAM OR CREAMERS, sugar ok)  Clear Tea (NO MILK/CREAM OR CREAMERS, sugar ok) regular and decaf                             Plain Jell-O (NO RED)                                           Fruit ices (not with fruit pulp, NO RED)                                     Popsicles (NO RED)                                                               Sports drinks like Gatorade (NO RED)                            If you have questions, please contact your surgeon's office.     Oral Hygiene is also important to reduce your risk of infection.                                    Remember - BRUSH YOUR TEETH THE MORNING OF SURGERY WITH YOUR  REGULAR TOOTHPASTE  DENTURES WILL BE REMOVED PRIOR TO SURGERY PLEASE DO NOT APPLY "Poly grip" OR ADHESIVES!!!   Do NOT smoke after Midnight   Take these medicines the morning of surgery with A SIP OF WATER:  none   DO NOT TAKE ANY ORAL DIABETIC  MEDICATIONS DAY OF YOUR SURGERY  Bring CPAP mask and tubing day of surgery.                              You may not have any metal on your body including hair pins, jewelry, and body piercing             Do not wear make-up, lotions, powders, perfumes/cologne, or deodorant  Do not wear nail polish including gel and S&S, artificial/acrylic nails, or any other type of covering on natural nails including finger and toenails. If you have artificial nails, gel coating, etc. that needs to be removed by a nail salon please have this removed prior to surgery or surgery may need to be canceled/ delayed if the surgeon/ anesthesia feels like they are unable to be safely monitored.   Do not shave  48 hours prior to surgery.               Men may shave face and neck.   Do not bring valuables to the hospital. [place].   Contacts, glasses, dentures or bridgework may not be worn into surgery.   Bring small overnight bag day of surgery.   DO NOT [place]. PHARMACY WILL DISPENSE MEDICATIONS LISTED ON YOUR MEDICATION LIST TO YOU DURING YOUR ADMISSION [place]!    Patients discharged on the day of surgery will not be allowed to drive home.  Someone NEEDS to stay with you for the first 24 hours after anesthesia.   Special Instructions: Bring a copy of your healthcare power of attorney and living will documents the day of surgery if you haven't scanned them before.              Please read over the following fact sheets you were given: IF [place] [phone]   If you received a COVID test during your pre-op visit   it is requested that you wear a mask when out in public, stay away from anyone that may not be feeling well and notify your surgeon if you develop symptoms. If you test positive for Covid or have been in contact with anyone that has tested positive in the last 10 days please notify you surgeon.    [place] - Preparing for Surgery Before surgery, you can play an important role.  Because skin is not sterile, your skin needs to be as free of germs as possible.  You can reduce the number of germs on your skin by washing with CHG (chlorahexidine gluconate) soap before surgery.  CHG is an antiseptic cleaner which kills germs and bonds with the skin to continue killing germs even after washing. Please DO NOT use if you have an allergy to CHG or antibacterial soaps.  If your skin becomes reddened/irritated stop using the CHG and inform your nurse when you arrive at Short Stay. Do not shave (including legs and underarms) for at least 48 hours prior to the first CHG shower.  You may shave your face/neck. Please follow these instructions carefully:  1.  Shower with CHG Soap the night before surgery and the  morning of Surgery.  2.  If you choose to wash your hair, wash your hair first as usual with your  normal  shampoo.  3.  After you shampoo, rinse your hair and body thoroughly to remove the  shampoo.                           4.  Use CHG as you would any other liquid soap.  You can apply chg directly  to the skin and wash                       Gently with a scrungie or clean washcloth.  5.  Apply the CHG Soap to your body ONLY FROM THE NECK DOWN.   Do not use on face/ open                           Wound or open sores. Avoid contact with eyes, ears mouth and genitals (private parts).                       Wash face,  Genitals (private parts) with your normal soap.             6.  Wash thoroughly, paying special attention to the area where your surgery  will be performed.  7.  Thoroughly rinse your body  with warm water from the neck down.  8.  DO NOT shower/wash with your normal soap after using and rinsing off  the CHG Soap.                9.  Pat yourself dry with a clean towel.            10.  Wear clean pajamas.            11.  Place clean sheets on your bed the night of your first shower and do not  sleep with pets. Day of Surgery : Do not apply any lotions/deodorants the morning of surgery.  Please wear clean clothes to the hospital/surgery center.  FAILURE TO FOLLOW THESE INSTRUCTIONS MAY RESULT IN THE CANCELLATION OF YOUR SURGERY PATIENT SIGNATURE_________________________________  NURSE SIGNATURE__________________________________  ________________________________________________________________________

## 2022-08-13 ENCOUNTER — Encounter (HOSPITAL_COMMUNITY): Payer: Self-pay

## 2022-08-13 ENCOUNTER — Inpatient Hospital Stay: Payer: Medicare Other | Attending: Psychiatry | Admitting: Gynecologic Oncology

## 2022-08-13 ENCOUNTER — Other Ambulatory Visit: Payer: Self-pay

## 2022-08-13 ENCOUNTER — Other Ambulatory Visit: Payer: Self-pay | Admitting: Gynecologic Oncology

## 2022-08-13 ENCOUNTER — Encounter (HOSPITAL_COMMUNITY)
Admission: RE | Admit: 2022-08-13 | Discharge: 2022-08-13 | Disposition: A | Discharge status: Home / Self Care | Payer: Medicare Other | Source: Ambulatory Visit | Attending: Psychiatry | Admitting: Psychiatry

## 2022-08-13 ENCOUNTER — Telehealth: Payer: Self-pay

## 2022-08-13 VITALS — BP 140/85 | HR 64 | Temp 97.9°F | Resp 18 | Ht 62.0 in | Wt 140.0 lb

## 2022-08-13 DIAGNOSIS — Z01812 Encounter for preprocedural laboratory examination: Secondary | ICD-10-CM | POA: Insufficient documentation

## 2022-08-13 DIAGNOSIS — R1900 Intra-abdominal and pelvic swelling, mass and lump, unspecified site: Secondary | ICD-10-CM | POA: Insufficient documentation

## 2022-08-13 HISTORY — DX: Pneumonia, unspecified organism: J18.9

## 2022-08-13 HISTORY — DX: Unspecified osteoarthritis, unspecified site: M19.90

## 2022-08-13 LAB — CBC WITH DIFFERENTIAL/PLATELET
Abs Immature Granulocytes: 0.01 10*3/uL (ref 0.00–0.07)
Basophils Absolute: 0 10*3/uL (ref 0.0–0.1)
Basophils Relative: 1 %
Eosinophils Absolute: 0.1 10*3/uL (ref 0.0–0.5)
Eosinophils Relative: 1 %
HCT: 38.7 % (ref 36.0–46.0)
Hemoglobin: 12.9 g/dL (ref 12.0–15.0)
Immature Granulocytes: 0 %
Lymphocytes Relative: 45 %
Lymphs Abs: 2.9 10*3/uL (ref 0.7–4.0)
MCH: 28.2 pg (ref 26.0–34.0)
MCHC: 33.3 g/dL (ref 30.0–36.0)
MCV: 84.7 fL (ref 80.0–100.0)
Monocytes Absolute: 0.6 10*3/uL (ref 0.1–1.0)
Monocytes Relative: 8 %
Neutro Abs: 3 10*3/uL (ref 1.7–7.7)
Neutrophils Relative %: 45 %
Platelets: 390 10*3/uL (ref 150–400)
RBC: 4.57 MIL/uL (ref 3.87–5.11)
RDW: 13.1 % (ref 11.5–15.5)
WBC: 6.5 10*3/uL (ref 4.0–10.5)
nRBC: 0 % (ref 0.0–0.2)

## 2022-08-13 LAB — COMPREHENSIVE METABOLIC PANEL
ALT: 26 U/L (ref 0–44)
AST: 24 U/L (ref 15–41)
Albumin: 4.6 g/dL (ref 3.5–5.0)
Alkaline Phosphatase: 61 U/L (ref 38–126)
Anion gap: 9 (ref 5–15)
BUN: 8 mg/dL (ref 6–20)
CO2: 28 mmol/L (ref 22–32)
Calcium: 9.8 mg/dL (ref 8.9–10.3)
Chloride: 101 mmol/L (ref 98–111)
Creatinine, Ser: 0.53 mg/dL (ref 0.44–1.00)
GFR, Estimated: >60 mL/min (ref >60)
Glucose, Bld: 98 mg/dL (ref 70–99)
Potassium: 4.1 mmol/L (ref 3.5–5.1)
Sodium: 138 mmol/L (ref 135–145)
Total Bilirubin: 0.6 mg/dL (ref 0.3–1.2)
Total Protein: 7.9 g/dL (ref 6.5–8.1)

## 2022-08-13 NOTE — Patient Instructions (Addendum)
Preparing for your Surgery  Plan for surgery on August 21, 2022 with Dr. Bernadene Bell at [facility] will be scheduled for robotic assisted laparoscopic pelvic mass excision. Dr. Kieth Brightly will plan on removal of the gallbladder and performing a hernia repair.  Pre-operative Testing -You will receive a phone call from presurgical testing at [facility] to arrange for a pre-operative appointment and lab work.  -Bring your insurance card, copy of an advanced directive if applicable, medication list  -At that visit, you will be asked to sign a consent for a possible blood transfusion in case a transfusion becomes necessary during surgery.  The need for a blood transfusion is rare but having consent is a necessary part of your care.     -You should not be taking blood thinners or aspirin at least ten days prior to surgery unless instructed by your surgeon.  -Do not take supplements such as fish oil (omega 3), red yeast rice, turmeric before your surgery. You want to avoid medications with aspirin in them including headache powders such as BC or Goody's), Excedrin migraine.  Day Before Surgery at [facility] DR. Kieth Brightly. You will be advised you can have clear liquids up until 3 hours before your surgery.    Your role in recovery Your role is to become active as soon as directed by your doctor, while still giving yourself time to heal.  Rest when you feel tired. You will be asked to do the following in order to speed your recovery:  - Cough and breathe deeply. This helps to clear and expand your lungs and can prevent pneumonia after surgery.  - [place]. Do mild physical activity. Walking or moving your legs help your circulation and body functions return to normal. Do not try to get up or walk alone the first time after surgery.   -If you develop swelling on one leg or the other, pain in the back of your leg, redness/warmth in one of  your legs, please call the office or go to the Emergency Room to have a doppler to rule out a blood clot. For shortness of breath, chest pain-seek care in the Emergency Room as soon as possible. - Actively manage your pain. Managing your pain lets you move in comfort. We will ask you to rate your pain on a scale of zero to 10. It is your responsibility to tell your doctor or nurse where and how much you hurt so your pain can be treated.  Special Considerations -If you are diabetic, you may be placed on insulin after surgery to have closer control over your blood sugars to promote healing and recovery.  This does not mean that you will be discharged on insulin.  If applicable, your oral antidiabetics will be resumed when you are tolerating a solid diet.  -Your final pathology results from surgery should be available around one week after surgery and the results will be relayed to you when available.  -Dr. Lahoma Crocker is the surgeon that assists your GYN Oncologist with surgery.  If you end up staying the night, the next day after your surgery you will either see Dr. Berline Lopes, Dr. Ernestina Patches, or Dr. Lahoma Crocker.  -FMLA forms can be faxed to [phone] and please allow 5-7 business days for completion.  Pain Management After Surgery -You will be prescribed pain medication and bowel regimen medications after surgery.   -Make sure that you have Tylenol and Ibuprofen  IF YOU ARE ABLE TO TAKE THESE MEDICATIONS at home to use on a regular basis after surgery for pain control. We recommend alternating the medications every hour to six hours since they work differently and are processed in the body differently for pain relief.  -Review the attached handout on narcotic use and their risks and side effects.   Bowel Regimen -It is important to prevent constipation and drink adequate amounts of liquids.   Risks of Surgery Risks of surgery are low but include bleeding, infection, damage to  surrounding structures, re-operation, blood clots, and very rarely death.   Blood Transfusion Information (For the consent to be signed before surgery)  We will be checking your blood type before surgery so in case of emergencies, we will know what type of blood you would need.                                            WHAT IS A BLOOD TRANSFUSION?  A transfusion is the replacement of blood or some of its parts. Blood is made up of multiple cells which provide different functions. Red blood cells carry oxygen and are used for blood loss replacement. White blood cells fight against infection. Platelets control bleeding. Plasma helps clot blood. Other blood products are available for specialized needs, such as hemophilia or other clotting disorders. BEFORE THE TRANSFUSION  Who gives blood for transfusions?  You may be able to donate blood to be used at a later date on yourself (autologous donation). Relatives can be asked to donate blood. This is generally not any safer than if you have received blood from a stranger. The same precautions are taken to ensure safety when a relative's blood is donated. Healthy volunteers who are fully evaluated to make sure their blood is safe. This is blood bank blood. Transfusion therapy is the safest it has ever been in the practice of medicine. Before blood is taken from a donor, a complete history is taken to make sure that person has no history of diseases nor engages in risky social behavior (examples are intravenous drug use or sexual activity with multiple partners). The donor's travel history is screened to minimize risk of transmitting infections, such as malaria. The donated blood is tested for signs of infectious diseases, such as HIV and hepatitis. The blood is then tested to be sure it is compatible with you in order to minimize the chance of a transfusion reaction. If you or a relative donates blood, this is often done in anticipation of surgery and  is not appropriate for emergency situations. It takes many days to process the donated blood. RISKS AND COMPLICATIONS Although transfusion therapy is very safe and saves many lives, the main dangers of transfusion include:  Getting an infectious disease. Developing a transfusion reaction. This is an allergic reaction to something in the blood you were given. Every precaution is taken to prevent this. The decision to have a blood transfusion has been considered carefully by your caregiver before blood is given. Blood is not given unless the benefits outweigh the risks.  AFTER SURGERY INSTRUCTIONS  Return to work: 4-6 weeks if applicable  Activity: 1. Be up and out of the bed during the day.  Take a nap if needed.  You may walk up steps but be careful and use the hand rail.  Stair climbing will tire you more than  you think, you may need to stop part way and rest.   2. No lifting or straining for 6 weeks over 10 pounds. No pushing, pulling, straining for 6 weeks.  3. No driving for around 1 week(s).  Do not drive if you are taking narcotic pain medicine and make sure that your reaction time has returned.   4. You can shower as soon as the next day after surgery. Shower daily.  Use your regular soap and water (not directly on the incision) and pat your incision(s) dry afterwards; don't rub.  No tub baths or submerging your body in water until cleared by your surgeon. If you have the soap that was given to you by pre-surgical testing that was used before surgery, you do not need to use it afterwards because this can irritate your incisions.   5. No sexual activity and nothing in the vagina for 4 weeks.  6. You may experience a small amount of clear drainage from your incisions, which is normal.  If the drainage persists, increases, or changes color please call the office.  7. Do not use creams, lotions, or ointments such as neosporin on your incisions after surgery until advised by your surgeon  because they can cause removal of the dermabond glue on your incisions.    8. Take Tylenol or ibuprofen first for pain if you are able to take these medications and only use narcotic pain medication for severe pain not relieved by the Tylenol or Ibuprofen.  Monitor your Tylenol intake to a max of 4,000 mg in a 24 hour period. You can alternate these medications after surgery.  Diet: 1. Low sodium Heart Healthy Diet is recommended but you are cleared to resume your normal (before surgery) diet after your procedure.  2. It is safe to use a laxative, such as Miralax or Colace, if you have difficulty moving your bowels.   Wound Care: 1. Keep clean and dry.  Shower daily.  Reasons to call the Doctor: Fever - Oral temperature greater than 100.4 degrees Fahrenheit Foul-smelling vaginal discharge Difficulty urinating Nausea and vomiting Increased pain at the site of the incision that is unrelieved with pain medicine. Difficulty breathing with or without chest pain New calf pain especially if only on one side Sudden, continuing increased vaginal bleeding with or without clots.   Contacts: For questions or concerns you should contact:  Dr. Bernadene Bell at [facility], NP at [phone]  After Hours: call [phone] and have the GYN Oncologist paged/contacted (after 5 pm or on the weekends).  Messages sent via mychart are for non-urgent matters and are not responded to after hours so for urgent needs, please call the after hours number.

## 2022-08-13 NOTE — Progress Notes (Signed)
Anesthesia Review:  [id number] Horton, NP  Cardiologist : none  Chest x-ray : EKG : Echo : Stress test: Cardiac Cath :  Activity level: can do a flgiht of stairs without difficulty  Sleep Study/ CPAP : none  Fasting Blood Sugar :      / Checks Blood Sugar -- times a day:   Blood Thinner/ Instructions /Last Dose: ASA / Instructions/ Last Dose :    DR Kinsinger orders requested on 08/10/22.  No orders in at preop appt.  Requested again on 08/13/22.

## 2022-08-13 NOTE — Telephone Encounter (Signed)
LM stating that her CBC and ANC is normal. It is fine to proceed with surgery as planned per Joylene John, NP

## 2022-08-14 ENCOUNTER — Encounter: Payer: Medicare Other | Admitting: Gynecologic Oncology

## 2022-08-14 ENCOUNTER — Other Ambulatory Visit (HOSPITAL_COMMUNITY): Payer: Medicare Other

## 2022-08-15 ENCOUNTER — Ambulatory Visit: Payer: Self-pay | Admitting: General Surgery

## 2022-08-16 ENCOUNTER — Encounter: Payer: Medicare Other | Admitting: Hematology and Oncology

## 2022-08-16 ENCOUNTER — Other Ambulatory Visit: Payer: Medicare Other

## 2022-08-20 ENCOUNTER — Telehealth: Payer: Self-pay

## 2022-08-20 NOTE — Telephone Encounter (Signed)
Telephone call to check on pre-operative status.  Patient voiced intent to comply with pre-operative instructions.  Reinforced nothing to eat after midnight. Clear liquids until 0430. Patient to arrive at [place].  Patient voiced having reviewed in detail the written pre-op instructions provided during pre-op visits. No questions or concerns voiced.  Instructed to call for any needs.

## 2022-08-20 NOTE — Anesthesia Preprocedure Evaluation (Signed)
Anesthesia Evaluation  Patient identified by MRN, date of birth, ID band Patient awake    Reviewed: Allergy & Precautions, NPO status , Patient's Chart, lab work & pertinent test results  History of Anesthesia Complications Negative for: history of anesthetic complications  Airway Mallampati: II  TM Distance: >3 FB Neck ROM: Full    Dental no notable dental hx.    Pulmonary    Pulmonary exam normal        Cardiovascular Normal cardiovascular exam     Neuro/Psych Seizures - (2011),     GI/Hepatic ,GERD  Medicated,,gallstones   Endo/Other    Renal/GU      Musculoskeletal  (+) Arthritis ,    Abdominal   Peds  Hematology   Anesthesia Other Findings   Reproductive/Obstetrics Pelvic mass                              Anesthesia Physical Anesthesia Plan  ASA: 2  Anesthesia Plan: General   Post-op Pain Management: Tylenol PO (pre-op)* and Toradol IV (intra-op)*   Induction: Intravenous  PONV Risk Score and Plan: 4 or greater and Treatment may vary due to age or medical condition, Midazolam, Scopolamine patch - Pre-op, Dexamethasone, Ondansetron, Propofol infusion and TIVA  Airway Management Planned: Oral ETT  Additional Equipment: None  Intra-op Plan:   Post-operative Plan: Extubation in OR  Informed Consent: I have reviewed the patients History and Physical, chart, labs and discussed the procedure including the risks, benefits and alternatives for the proposed anesthesia with the patient or authorized representative who has indicated his/her understanding and acceptance.     Dental advisory given  Plan Discussed with: CRNA  Anesthesia Plan Comments:         Anesthesia Quick Evaluation

## 2022-08-21 ENCOUNTER — Ambulatory Visit (HOSPITAL_COMMUNITY)
Admission: RE | Admit: 2022-08-21 | Discharge: 2022-08-21 | Disposition: A | Discharge status: Home / Self Care | Payer: Medicare Other | Attending: Psychiatry | Admitting: Psychiatry

## 2022-08-21 ENCOUNTER — Ambulatory Visit (HOSPITAL_COMMUNITY): Payer: Medicare Other | Admitting: Physician Assistant

## 2022-08-21 ENCOUNTER — Encounter (HOSPITAL_COMMUNITY)
Admission: RE | Disposition: A | Discharge status: Home / Self Care | Payer: Self-pay | Source: Home / Self Care | Attending: Psychiatry

## 2022-08-21 ENCOUNTER — Other Ambulatory Visit: Payer: Self-pay

## 2022-08-21 ENCOUNTER — Ambulatory Visit (HOSPITAL_BASED_OUTPATIENT_CLINIC_OR_DEPARTMENT_OTHER): Payer: Medicare Other | Admitting: Anesthesiology

## 2022-08-21 DIAGNOSIS — Z90710 Acquired absence of both cervix and uterus: Secondary | ICD-10-CM | POA: Insufficient documentation

## 2022-08-21 DIAGNOSIS — K432 Incisional hernia without obstruction or gangrene: Secondary | ICD-10-CM | POA: Diagnosis not present

## 2022-08-21 DIAGNOSIS — D259 Leiomyoma of uterus, unspecified: Secondary | ICD-10-CM

## 2022-08-21 DIAGNOSIS — N809 Endometriosis, unspecified: Secondary | ICD-10-CM | POA: Insufficient documentation

## 2022-08-21 DIAGNOSIS — K8010 Calculus of gallbladder with chronic cholecystitis without obstruction: Secondary | ICD-10-CM | POA: Diagnosis not present

## 2022-08-21 DIAGNOSIS — K8020 Calculus of gallbladder without cholecystitis without obstruction: Secondary | ICD-10-CM

## 2022-08-21 DIAGNOSIS — D367 Benign neoplasm of other specified sites: Secondary | ICD-10-CM | POA: Diagnosis not present

## 2022-08-21 DIAGNOSIS — R1900 Intra-abdominal and pelvic swelling, mass and lump, unspecified site: Secondary | ICD-10-CM

## 2022-08-21 DIAGNOSIS — Z87891 Personal history of nicotine dependence: Secondary | ICD-10-CM | POA: Insufficient documentation

## 2022-08-21 DIAGNOSIS — D379 Neoplasm of uncertain behavior of digestive organ, unspecified: Secondary | ICD-10-CM

## 2022-08-21 HISTORY — PX: CHOLECYSTECTOMY: SHX55

## 2022-08-21 HISTORY — PX: EPIGASTRIC HERNIA REPAIR: SHX404

## 2022-08-21 LAB — ABO/RH: ABO/RH(D): AB POS

## 2022-08-21 LAB — TYPE AND SCREEN
ABO/RH(D): AB POS
Antibody Screen: NEGATIVE

## 2022-08-21 SURGERY — LAPAROSCOPY, DIAGNOSTIC, ROBOT-ASSISTED
Anesthesia: General

## 2022-08-21 MED ORDER — LIDOCAINE HCL (PF) 2 % IJ SOLN
INTRAMUSCULAR | Status: AC
  Filled 2022-08-21: qty 5

## 2022-08-21 MED ORDER — LACTATED RINGERS IR SOLN
Status: DC | PRN
  Administered 2022-08-21: 1000 mL

## 2022-08-21 MED ORDER — CHLORHEXIDINE GLUCONATE CLOTH 2 % EX PADS: 6.0000 | MEDICATED_PAD | Freq: Once | CUTANEOUS | Status: DC

## 2022-08-21 MED ORDER — SCOPOLAMINE 1 MG/3DAYS TD PT72: 1.0000 | MEDICATED_PATCH | TRANSDERMAL | Status: DC

## 2022-08-21 MED ORDER — PROPOFOL 1000 MG/100ML IV EMUL
INTRAVENOUS | Status: AC
  Filled 2022-08-21: qty 100

## 2022-08-21 MED ORDER — ONDANSETRON HCL 4 MG/2ML IJ SOLN
INTRAMUSCULAR | Status: DC | PRN
  Administered 2022-08-21: 4 mg via INTRAVENOUS

## 2022-08-21 MED ORDER — CIPROFLOXACIN IN D5W 400 MG/200ML IV SOLN
400.0000 mg | INTRAVENOUS | Status: AC
  Administered 2022-08-21: 400 mg via INTRAVENOUS
  Filled 2022-08-21: qty 200

## 2022-08-21 MED ORDER — HEMOSTATIC AGENTS (NO CHARGE) OPTIME
TOPICAL | Status: DC | PRN
  Administered 2022-08-21: 1 via TOPICAL

## 2022-08-21 MED ORDER — AMISULPRIDE (ANTIEMETIC) 5 MG/2ML IV SOLN: 10.0000 mg | Freq: Once | INTRAVENOUS | Status: DC | PRN

## 2022-08-21 MED ORDER — LIDOCAINE 2% (20 MG/ML) 5 ML SYRINGE
INTRAMUSCULAR | Status: DC | PRN
  Administered 2022-08-21: 60 mg via INTRAVENOUS

## 2022-08-21 MED ORDER — HYDROMORPHONE HCL 1 MG/ML IJ SOLN: 0.2500 mg | INTRAMUSCULAR | Status: DC | PRN

## 2022-08-21 MED ORDER — BUPIVACAINE HCL 0.25 % IJ SOLN
INTRAMUSCULAR | Status: DC | PRN
  Administered 2022-08-21: 47 mL

## 2022-08-21 MED ORDER — DEXAMETHASONE SODIUM PHOSPHATE 10 MG/ML IJ SOLN
INTRAMUSCULAR | Status: AC
  Filled 2022-08-21: qty 1

## 2022-08-21 MED ORDER — PROPOFOL 10 MG/ML IV BOLUS
INTRAVENOUS | Status: AC
  Filled 2022-08-21: qty 20

## 2022-08-21 MED ORDER — ROCURONIUM BROMIDE 10 MG/ML (PF) SYRINGE
PREFILLED_SYRINGE | INTRAVENOUS | Status: AC
  Filled 2022-08-21: qty 10

## 2022-08-21 MED ORDER — FENTANYL CITRATE (PF) 100 MCG/2ML IJ SOLN
INTRAMUSCULAR | Status: AC
  Filled 2022-08-21: qty 2

## 2022-08-21 MED ORDER — LACTATED RINGERS IV SOLN: INTRAVENOUS | Status: DC

## 2022-08-21 MED ORDER — SCOPOLAMINE 1 MG/3DAYS TD PT72
1.0000 | MEDICATED_PATCH | Freq: Once | TRANSDERMAL | Status: DC
  Administered 2022-08-21: 1.5 mg via TRANSDERMAL
  Filled 2022-08-21: qty 1

## 2022-08-21 MED ORDER — FENTANYL CITRATE (PF) 100 MCG/2ML IJ SOLN
INTRAMUSCULAR | Status: DC | PRN
  Administered 2022-08-21: 25 ug via INTRAVENOUS
  Administered 2022-08-21: 50 ug via INTRAVENOUS
  Administered 2022-08-21: 100 ug via INTRAVENOUS
  Administered 2022-08-21 (×2): 50 ug via INTRAVENOUS
  Administered 2022-08-21: 25 ug via INTRAVENOUS

## 2022-08-21 MED ORDER — MIDAZOLAM HCL 2 MG/2ML IJ SOLN
INTRAMUSCULAR | Status: AC
  Filled 2022-08-21: qty 2

## 2022-08-21 MED ORDER — KETOROLAC TROMETHAMINE 30 MG/ML IJ SOLN
INTRAMUSCULAR | Status: DC | PRN
  Administered 2022-08-21: 30 mg via INTRAVENOUS

## 2022-08-21 MED ORDER — HEPARIN SODIUM (PORCINE) 5000 UNIT/ML IJ SOLN
5000.0000 [IU] | INTRAMUSCULAR | Status: AC
  Administered 2022-08-21: 5000 [IU] via SUBCUTANEOUS
  Filled 2022-08-21: qty 1

## 2022-08-21 MED ORDER — PROPOFOL 500 MG/50ML IV EMUL
INTRAVENOUS | Status: AC
  Filled 2022-08-21: qty 50

## 2022-08-21 MED ORDER — ONDANSETRON HCL 4 MG/2ML IJ SOLN
INTRAMUSCULAR | Status: AC
  Filled 2022-08-21: qty 2

## 2022-08-21 MED ORDER — SUGAMMADEX SODIUM 200 MG/2ML IV SOLN
INTRAVENOUS | Status: DC | PRN
  Administered 2022-08-21: 125 mg via INTRAVENOUS

## 2022-08-21 MED ORDER — LACTATED RINGERS IV SOLN: INTRAVENOUS | Status: DC | PRN

## 2022-08-21 MED ORDER — BUPIVACAINE HCL 0.25 % IJ SOLN
INTRAMUSCULAR | Status: AC
  Filled 2022-08-21: qty 1

## 2022-08-21 MED ORDER — ACETAMINOPHEN 500 MG PO TABS
1000.0000 mg | ORAL_TABLET | Freq: Once | ORAL | Status: AC
  Administered 2022-08-21: 1000 mg via ORAL
  Filled 2022-08-21: qty 2

## 2022-08-21 MED ORDER — DEXAMETHASONE SODIUM PHOSPHATE 10 MG/ML IJ SOLN
INTRAMUSCULAR | Status: DC | PRN
  Administered 2022-08-21: 10 mg via INTRAVENOUS

## 2022-08-21 MED ORDER — GABAPENTIN 300 MG PO CAPS
300.0000 mg | ORAL_CAPSULE | ORAL | Status: AC
  Administered 2022-08-21: 300 mg via ORAL
  Filled 2022-08-21: qty 1

## 2022-08-21 MED ORDER — PROPOFOL 10 MG/ML IV BOLUS
INTRAVENOUS | Status: DC | PRN
  Administered 2022-08-21: 130 ug/kg/min via INTRAVENOUS
  Administered 2022-08-21: 120 mg via INTRAVENOUS

## 2022-08-21 MED ORDER — OXYCODONE HCL 5 MG PO TABS: 5.0000 mg | ORAL_TABLET | Freq: Four times a day (QID) | ORAL | 0 refills | Status: DC | PRN

## 2022-08-21 MED ORDER — DEXAMETHASONE SODIUM PHOSPHATE 4 MG/ML IJ SOLN: 4.0000 mg | INTRAMUSCULAR | Status: DC

## 2022-08-21 MED ORDER — ROCURONIUM BROMIDE 10 MG/ML (PF) SYRINGE
PREFILLED_SYRINGE | INTRAVENOUS | Status: DC | PRN
  Administered 2022-08-21: 60 mg via INTRAVENOUS
  Administered 2022-08-21 (×3): 20 mg via INTRAVENOUS

## 2022-08-21 MED ORDER — MIDAZOLAM HCL 5 MG/5ML IJ SOLN
INTRAMUSCULAR | Status: DC | PRN
  Administered 2022-08-21: 2 mg via INTRAVENOUS

## 2022-08-21 MED ORDER — KETOROLAC TROMETHAMINE 15 MG/ML IJ SOLN: 15.0000 mg | INTRAMUSCULAR | Status: DC

## 2022-08-21 SURGICAL SUPPLY — 124 items
APPLICATOR SURGIFLO ENDO (HEMOSTASIS) IMPLANT
APPLIER CLIP 5 13 M/L LIGAMAX5 (MISCELLANEOUS) ×1
APPLIER CLIP ROT 10 11.4 M/L (STAPLE)
BAG COUNTER SPONGE SURGICOUNT (BAG) IMPLANT
BAG LAPAROSCOPIC 12 15 PORT 16 (BASKET) IMPLANT
BAG RETRIEVAL 12/15 (BASKET)
BENZOIN TINCTURE PRP APPL 2/3 (GAUZE/BANDAGES/DRESSINGS) IMPLANT
BLADE SURG SZ10 CARB STEEL (BLADE) IMPLANT
BNDG ADH 1X3 SHEER STRL LF (GAUZE/BANDAGES/DRESSINGS) IMPLANT
CABLE HIGH FREQUENCY MONO STRZ (ELECTRODE) ×1 IMPLANT
CANNULA REDUC XI 12-8 STAPL (CANNULA) ×1
CANNULA REDUCER 12-8 DVNC XI (CANNULA) IMPLANT
CHLORAPREP W/TINT 26 (MISCELLANEOUS) ×1 IMPLANT
CLIP APPLIE 5 13 M/L LIGAMAX5 (MISCELLANEOUS) ×1 IMPLANT
CLIP APPLIE ROT 10 11.4 M/L (STAPLE) IMPLANT
CLIP LIGATING HEM O LOK PURPLE (MISCELLANEOUS) IMPLANT
CLIP LIGATING HEMO O LOK GREEN (MISCELLANEOUS) ×1 IMPLANT
COVER BACK TABLE 60X90IN (DRAPES) ×1 IMPLANT
COVER MAYO STAND XLG (MISCELLANEOUS) ×1 IMPLANT
COVER SURGICAL LIGHT HANDLE (MISCELLANEOUS) ×1 IMPLANT
COVER TIP SHEARS 8 DVNC (MISCELLANEOUS) ×1 IMPLANT
COVER TIP SHEARS 8MM DA VINCI (MISCELLANEOUS) ×1
DERMABOND ADVANCED .7 DNX12 (GAUZE/BANDAGES/DRESSINGS) ×1 IMPLANT
DRAIN CHANNEL 19F RND (DRAIN) IMPLANT
DRAPE ARM DVNC X/XI (DISPOSABLE) ×4 IMPLANT
DRAPE C-ARM 42X120 X-RAY (DRAPES) IMPLANT
DRAPE COLUMN DVNC XI (DISPOSABLE) ×1 IMPLANT
DRAPE DA VINCI XI ARM (DISPOSABLE) ×4
DRAPE DA VINCI XI COLUMN (DISPOSABLE) ×1
DRAPE SHEET LG 3/4 BI-LAMINATE (DRAPES) ×1 IMPLANT
DRAPE SURG IRRIG POUCH 19X23 (DRAPES) ×1 IMPLANT
DRSG OPSITE POSTOP 4X6 (GAUZE/BANDAGES/DRESSINGS) IMPLANT
DRSG OPSITE POSTOP 4X8 (GAUZE/BANDAGES/DRESSINGS) IMPLANT
ELECT PENCIL ROCKER SW 15FT (MISCELLANEOUS) IMPLANT
ELECT REM PT RETURN 15FT ADLT (MISCELLANEOUS) ×1 IMPLANT
EVACUATOR SILICONE 100CC (DRAIN) IMPLANT
GAUZE 4X4 16PLY ~~LOC~~+RFID DBL (SPONGE) ×1 IMPLANT
GLOVE BIOGEL PI IND STRL 6.5 (GLOVE) ×2 IMPLANT
GLOVE BIOGEL PI IND STRL 7.0 (GLOVE) ×1 IMPLANT
GLOVE SURG SS PI 6.0 STRL IVOR (GLOVE) IMPLANT
GLOVE SURG SS PI 6.5 STRL IVOR (GLOVE) IMPLANT
GLOVE SURG SS PI 7.0 STRL IVOR (GLOVE) ×1 IMPLANT
GOWN STRL REUS W/ TWL LRG LVL3 (GOWN DISPOSABLE) ×5 IMPLANT
GOWN STRL REUS W/TWL LRG LVL3 (GOWN DISPOSABLE) ×6
GRASPER SUT TROCAR 14GX15 (MISCELLANEOUS) ×1 IMPLANT
HOLDER FOLEY CATH W/STRAP (MISCELLANEOUS) IMPLANT
IRRIG SUCT STRYKERFLOW 2 WTIP (MISCELLANEOUS) ×2
IRRIGATION SUCT STRKRFLW 2 WTP (MISCELLANEOUS) ×2 IMPLANT
KIT BASIN OR (CUSTOM PROCEDURE TRAY) ×1 IMPLANT
KIT PROCEDURE DA VINCI SI (MISCELLANEOUS)
KIT PROCEDURE DVNC SI (MISCELLANEOUS) IMPLANT
KIT TURNOVER KIT A (KITS) IMPLANT
LEGGING LITHOTOMY PAIR STRL (DRAPES) IMPLANT
LIGASURE IMPACT 36 18CM CVD LR (INSTRUMENTS) IMPLANT
MANIPULATOR ADVINCU DEL 3.0 PL (MISCELLANEOUS) IMPLANT
MANIPULATOR ADVINCU DEL 3.5 PL (MISCELLANEOUS) IMPLANT
MANIPULATOR UTERINE 4.5 ZUMI (MISCELLANEOUS) IMPLANT
NDL HYPO 21X1.5 SAFETY (NEEDLE) ×1 IMPLANT
NDL INSUFFLATION 14GA 120MM (NEEDLE) IMPLANT
NDL SPNL 18GX3.5 QUINCKE PK (NEEDLE) IMPLANT
NEEDLE HYPO 21X1.5 SAFETY (NEEDLE) ×1 IMPLANT
NEEDLE INSUFFLATION 14GA 120MM (NEEDLE) ×1 IMPLANT
NEEDLE SPNL 18GX3.5 QUINCKE PK (NEEDLE) IMPLANT
OBTURATOR OPTICAL STANDARD 8MM (TROCAR) ×1
OBTURATOR OPTICAL STND 8 DVNC (TROCAR) ×1
OBTURATOR OPTICALSTD 8 DVNC (TROCAR) ×1 IMPLANT
PACK ROBOT GYN CUSTOM WL (TRAY / TRAY PROCEDURE) ×1 IMPLANT
PAD ARMBOARD 7.5X6 YLW CONV (MISCELLANEOUS) ×1 IMPLANT
PAD POSITIONING PINK XL (MISCELLANEOUS) ×1 IMPLANT
PORT ACCESS TROCAR AIRSEAL 12 (TROCAR) IMPLANT
POUCH RETRIEVAL ECOSAC 10 (ENDOMECHANICALS) ×1 IMPLANT
POUCH RETRIEVAL ECOSAC 10MM (ENDOMECHANICALS) ×1
RELOAD STAPLE 60 3.5 BLU DVNC (STAPLE) IMPLANT
RELOAD STAPLER 3.5X60 BLU DVNC (STAPLE) ×1 IMPLANT
SCISSORS LAP 5X35 DISP (ENDOMECHANICALS) ×1 IMPLANT
SCRUB CHG 4% DYNA-HEX 4OZ (MISCELLANEOUS) IMPLANT
SEAL CANN UNIV 5-8 DVNC XI (MISCELLANEOUS) ×4 IMPLANT
SEAL XI 5MM-8MM UNIVERSAL (MISCELLANEOUS) ×4
SET CHOLANGIOGRAPH MIX (MISCELLANEOUS) IMPLANT
SET TRI-LUMEN FLTR TB AIRSEAL (TUBING) ×1 IMPLANT
SET TUBE SMOKE EVAC HIGH FLOW (TUBING) ×1 IMPLANT
SLEEVE Z-THREAD 5X100MM (TROCAR) ×2 IMPLANT
SOL PREP POV-IOD 4OZ 10% (MISCELLANEOUS) ×2 IMPLANT
SPIKE FLUID TRANSFER (MISCELLANEOUS) ×2 IMPLANT
SPONGE T-LAP 18X18 ~~LOC~~+RFID (SPONGE) IMPLANT
STAPLER 60 DA VINCI SURE FORM (STAPLE) ×1
STAPLER 60 SUREFORM DVNC (STAPLE) IMPLANT
STAPLER CANNULA SEAL DVNC XI (STAPLE) IMPLANT
STAPLER CANNULA SEAL XI (STAPLE) ×1
STAPLER RELOAD 3.5X60 BLU DVNC (STAPLE) ×1
STAPLER RELOAD 3.5X60 BLUE (STAPLE) ×1
STRIP CLOSURE SKIN 1/2X4 (GAUZE/BANDAGES/DRESSINGS) IMPLANT
SURGIFLO W/THROMBIN 8M KIT (HEMOSTASIS) IMPLANT
SUT ETHILON 2 0 PS N (SUTURE) IMPLANT
SUT MNCRL AB 4-0 PS2 18 (SUTURE) ×1 IMPLANT
SUT PDS AB 1 TP1 96 (SUTURE) IMPLANT
SUT PDS PLUS 0 (SUTURE) ×3
SUT PDS PLUS AB 0 CT-2 (SUTURE) IMPLANT
SUT VIC AB 0 CT1 27 (SUTURE)
SUT VIC AB 0 CT1 27XBRD ANTBC (SUTURE) IMPLANT
SUT VIC AB 2-0 CT1 27 (SUTURE)
SUT VIC AB 2-0 CT1 TAPERPNT 27 (SUTURE) IMPLANT
SUT VIC AB 3-0 SH 27 (SUTURE) ×1
SUT VIC AB 3-0 SH 27X BRD (SUTURE) IMPLANT
SUT VIC AB 4-0 PS2 18 (SUTURE) ×2 IMPLANT
SUT VICRYL 0 ENDOLOOP (SUTURE) IMPLANT
SUT VLOC 180 0 9IN  GS21 (SUTURE)
SUT VLOC 180 0 9IN GS21 (SUTURE) IMPLANT
SYR 10ML LL (SYRINGE) IMPLANT
SYS BAG RETRIEVAL 10MM (BASKET) ×1
SYS WOUND ALEXIS 18CM MED (MISCELLANEOUS)
SYSTEM BAG RETRIEVAL 10MM (BASKET) IMPLANT
SYSTEM WOUND ALEXIS 18CM MED (MISCELLANEOUS) IMPLANT
TOWEL OR 17X26 10 PK STRL BLUE (TOWEL DISPOSABLE) ×1 IMPLANT
TOWEL OR NON WOVEN STRL DISP B (DISPOSABLE) IMPLANT
TRAP SPECIMEN MUCUS 40CC (MISCELLANEOUS) IMPLANT
TRAY FOLEY W/BAG SLVR 14FR LF (SET/KITS/TRAYS/PACK) IMPLANT
TRAY LAPAROSCOPIC (CUSTOM PROCEDURE TRAY) ×1 IMPLANT
TROCAR ADV FIXATION 12X100MM (TROCAR) ×1 IMPLANT
TROCAR PORT AIRSEAL 5X120 (TROCAR) IMPLANT
TROCAR Z-THREAD OPTICAL 5X100M (TROCAR) ×1 IMPLANT
UNDERPAD 30X36 HEAVY ABSORB (UNDERPADS AND DIAPERS) ×2 IMPLANT
WATER STERILE IRR 1000ML POUR (IV SOLUTION) ×1 IMPLANT
YANKAUER SUCT BULB TIP 10FT TU (MISCELLANEOUS) IMPLANT

## 2022-08-21 NOTE — H&P (Signed)
Chief Complaint: New Consultation (Epigastric Hernia and Gallstones )   History of Present Illness: Erin Ray is a 42 y.o. female who is seen today as an office consultation for evaluation of New Consultation (Epigastric Hernia and Gallstones ) .   She is having abdominal pain radiating to her groin as well as right sided abdominal pain. The right sided pain is constant. She had nausea and vomiting last month but not this month. She has decreased intake with some improvement but not resolution. She denies diarrhea.  Review of Systems: A complete review of systems was obtained from the patient. I have reviewed this information and discussed as appropriate with the patient. See HPI as well for other ROS.  Review of Systems  Constitutional: Negative.  HENT: Negative.  Eyes: Negative.  Respiratory: Negative.  Cardiovascular: Negative.  Gastrointestinal: Negative.  Genitourinary: Negative.  Musculoskeletal: Negative.  Skin: Negative.  Neurological: Negative.  Endo/Heme/Allergies: Negative.  Psychiatric/Behavioral: Negative.    Medical History: Past Medical History:  Diagnosis Date  Arthritis  Fibroids   There is no problem list on file for this patient.  Past Surgical History:  Procedure Laterality Date  Brain Surgery - [facility] 2011  ESOPHAGEAL DILATION 2011  HYSTERECTOMY 2013  DILATATION & CURETTAGE/HYSTEROSCOPY WITH RESECTOCOPE (Vagina ) 02/12/2012  Dr. Seward Speck  ROBOTIC ASSISTED TOTAL HYSTERECTOMY (Abdomen) BILATERAL SALPINGECTOMY (Bilateral: Abdomen) CYSTOSCOPY 10/09/2012  Dr. Idolina Primer    Allergies  Allergen Reactions  Cefuroxime Axetil Diarrhea  Per [facility]  diarrhea  Dicyclomine Rash  Penicillins Hives  Acetaminophen Rash  Adhesive Tape-Silicones Rash  No tape containing latex!!  Latex Rash   Current Outpatient Medications on File Prior to Visit  Medication Sig Dispense Refill  acetaminophen (TYLENOL) 500 MG tablet Take by mouth   diphenhydrAMINE (BENADRYL) 12.5 mg/5 mL solution Take by mouth  levocetirizine (XYZAL) 5 MG tablet  oxyCODONE (ROXICODONE) 5 MG immediate release tablet Take by mouth  pseudoephedrine (SUDAFED 12 HR) 120 mg 12 hr tablet Take by mouth   No current facility-administered medications on file prior to visit.   Family History  Problem Relation Age of Onset  High blood pressure (Hypertension) Mother  Breast cancer Paternal Aunt  Prostate cancer Paternal Uncle  Lung cancer Maternal Grandmother  Colon cancer Paternal Grandmother  Breast cancer Paternal Grandmother  Ovarian cancer Paternal Grandmother  Lung cancer Paternal Grandfather    Social History   Tobacco Use  Smoking Status Former  Types: Cigarettes  Quit date: 2006  Years since quitting: 17.9  Smokeless Tobacco Never    Social History   Socioeconomic History  Marital status: Single  Tobacco Use  Smoking status: Former  Types: Cigarettes  Quit date: 2006  Years since quitting: 17.9  Smokeless tobacco: Never  Substance and Sexual Activity  Alcohol use: Never  Drug use: Never   Objective:   Vitals:  06/21/22 1449  BP: 130/70  Pulse: 83  Temp: 36.7 C (98 F)  SpO2: 98%  Weight: 64.4 kg (142 lb)  Height: 157.5 cm (5' 2"$ )  PainSc: 8   Body mass index is 25.97 kg/m.  Physical Exam Constitutional:  Appearance: Normal appearance.  HENT:  Head: Normocephalic and atraumatic.  Pulmonary:  Effort: Pulmonary effort is normal.  Abdominal:  Comments: Tender right upper quadrant, supraumbilical scar with underlying small hernia  Musculoskeletal:  General: Normal range of motion.  Cervical back: Normal range of motion.  Neurological:  General: No focal deficit present.  Mental Status: She is alert and oriented to person,  place, and time. Mental status is at baseline.  Psychiatric:  Mood and Affect: Mood normal.  Behavior: Behavior normal.  Thought Content: Thought content normal.    Labs, Imaging and  Diagnostic Testing: I reviewed notes by Roma Kayser and Carl Best. I reviewed images of the recent CT scan showing multiple large gallstones and moderate epigastric hernia  Assessment and Plan:   Diagnoses and all orders for this visit:  Calculus of gallbladder with chronic cholecystitis without obstruction  Epigastric hernia    We discussed the etiology of gallstones and probably cause pain. We discussed exacerbating factors including fatty meals. We discussed the details of surgery for removal of the gallbladder including general anesthesia, 4 small incisions in the patient's abdomen, removal of the patient's gallbladder with the liver and common bile duct, and most likely outpatient procedure. We discussed risks of common bile duct injury, cystic duct stump leak, injury to liver, bleeding, infection, need for open procedure, and post cholecystectomy syndrome. The patient showed good understanding and wanted to proceed with laparoscopic cholecystectomy.

## 2022-08-21 NOTE — Transfer of Care (Signed)
Immediate Anesthesia Transfer of Care Note  Patient: ILLYA ROSTEK  Procedure(s) Performed: XI ROBOTIC ASSISTED DIAGNOSTIC LAPAROSCOPY WITH PELVIC MASS EXCISION LAPAROSCOPIC CHOLECYSTECTOMY HERNIA REPAIR EPIGASTRIC ADULT  Patient Location: PACU  Anesthesia Type:General  Level of Consciousness: awake, alert , oriented, and patient cooperative  Airway & Oxygen Therapy: Patient Spontanous Breathing and Patient connected to face mask oxygen  Post-op Assessment: Report given to RN, Post -op Vital signs reviewed and stable, and Patient moving all extremities  Post vital signs: Reviewed and stable  Last Vitals:  Vitals Value Taken Time  BP 119/81 08/21/22 1149  Temp    Pulse 69 08/21/22 1152  Resp 13 08/21/22 1152  SpO2 100 % 08/21/22 1152  Vitals shown include unvalidated device data.  Last Pain:  Vitals:   08/21/22 0619  TempSrc:   PainSc: 0-No pain         Complications: No notable events documented.

## 2022-08-21 NOTE — Op Note (Addendum)
PATIENT:  Erin Ray  42 y.o. female  PRE-OPERATIVE DIAGNOSIS:  PELVIC MASS, GALLSTONES, 1.5 cm incisional hernia  POST-OPERATIVE DIAGNOSIS:  PELVIC MASS, GALLSTONES, 1.5 cm incisional hernia  PROCEDURE:  laparoscopic cholecystectomy, open primary 1 cm incisional hernia repair  SURGEON:  Gurney Maxin, M.D.  ASSISTANT: Bernadene Bell  ANESTHESIA:   local and general  Indications for procedure: Erin Ray is a 42 y.o. female with symptoms of Abdominal pain and Nausea and vomiting consistent with gallbladder disease, Confirmed by CT.  Description of procedure: The patient was brought into the operative suite, placed supine. Anesthesia was administered with endotracheal tube. Patient was strapped in place and foot board was secured. All pressure points were offloaded by foam padding. The patient was prepped and draped in the usual sterile fashion.  A left subcostal incision was made and optical entry was used to enter the abdomen. 2 5 mm trocars were placed on in the right lateral space on in the right subcostal space. A 8 mm trocar was placed in through the epigastric incisional hernia. Marcaine was infused to the subxiphoid space and lateral upper right abdomen in the transversus abdominis plane. Next the patient was placed in reverse trendelenberg. The gallbladder appearedfull of stones and chronically inflamed. Omentum was adhered to the gallbladder and was taken down with cautery/blunt dissection.  The gallbladder was retracted cephalad and lateral. The peritoneum was reflected off the infundibulum working lateral to medial. The cystic duct and cystic artery were identified and further dissection revealed a critical view. The cystic duct and cystic artery were doubly clipped and ligated.   The gallbladder was removed off the liver bed with cautery. The Gallbladder was placed in a specimen bag. The gallbladder fossa was irrigated and hemostasis was applied with cautery. The  gallbladder was removed via the 8 mm trocar.   Dr. Ernestina Patches came in and performed her pelvic mass excision. Please see her note for more information.  Next, the fascial edges of the epigastric hernia site was dissected free of the subcutaneous tissue. After all dissection and enlargement for specimen removal, the hernia was 3 cm in length. It was repaired with interrupted 0 PDS. 3-0 vicryl was used to appose the deep dermal space. Pneumoperitoneum was removed, all trocar were removed. All incisions were closed with 4-0 monocryl subcuticular stitch. The patient woke from anesthesia and was brought to PACU in stable condition. All counts were correct  Findings: chronic cholecystitis with stones  Specimen: gallbladder  Blood loss: 30 ml  Local anesthesia: 47 ml Marcaine  Complications: none  PLAN OF CARE: Discharge to home after PACU  PATIENT DISPOSITION:  PACU - hemodynamically stable.  [facility], [place]

## 2022-08-21 NOTE — Anesthesia Postprocedure Evaluation (Signed)
Anesthesia Post Note  Patient: Erin Ray  Procedure(s) Performed: XI ROBOTIC ASSISTED DIAGNOSTIC LAPAROSCOPY WITH PELVIC MASS EXCISION LAPAROSCOPIC CHOLECYSTECTOMY HERNIA REPAIR EPIGASTRIC ADULT     Patient location during evaluation: PACU Anesthesia Type: General Level of consciousness: awake and alert Pain management: pain level controlled Vital Signs Assessment: post-procedure vital signs reviewed and stable Respiratory status: spontaneous breathing, nonlabored ventilation and respiratory function stable Cardiovascular status: blood pressure returned to baseline Postop Assessment: no apparent nausea or vomiting Anesthetic complications: no   No notable events documented.  Last Vitals:  Vitals:   08/21/22 1315 08/21/22 1320  BP:  133/88  Pulse: (!) 59 (!) 59  Resp: 14 15  Temp:    SpO2: 100% 99%    Last Pain:  Vitals:   08/21/22 1245  TempSrc:   PainSc: 0-No pain                 Marthenia Rolling

## 2022-08-21 NOTE — Anesthesia Procedure Notes (Signed)
Procedure Name: Intubation Date/Time: 08/21/2022 7:42 AM  Performed by: Victoriano Lain, CRNAPre-anesthesia Checklist: Patient identified, Emergency Drugs available, Suction available, Timeout performed and Patient being monitored Patient Re-evaluated:Patient Re-evaluated prior to induction Oxygen Delivery Method: Circle system utilized Preoxygenation: Pre-oxygenation with 100% oxygen Induction Type: IV induction Laryngoscope Size: Glidescope and 3 Grade View: Grade I Tube type: Parker flex tip Tube size: 7.0 mm Number of attempts: 2 Airway Equipment and Method: Video-laryngoscopy and Rigid stylet Placement Confirmation: ETT inserted through vocal cords under direct vision, positive ETCO2 and breath sounds checked- equal and bilateral Secured at: 22 cm Tube secured with: Tape Dental Injury: Teeth and Oropharynx as per pre-operative assessment  Difficulty Due To: Difficult Airway- due to anterior larynx Comments: Smooth IV induction. Easy mask. DL x 1 with Mac 4. Grade 2-3 view. Blade removed. Easy mask. DL X 1 with Glidescope #3. Grade 1 view. ATOI. ETT secured at 22 cm at the lip. + ETCO2. VSS

## 2022-08-21 NOTE — Op Note (Signed)
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 Kieth Brightly)  Surgeon: Bernadene Bell, MD  Assistants: Lahoma Crocker, MD and (an MD assistant was necessary for tissue manipulation, management of robotic instrumentation, retraction and positioning due to the complexity of the case and hospital policies)  Anesthesia: General  Estimated Blood Loss: 50 mL    Fluids: 1500 ml, crystalloid  Urine Output: 400 ml, clear yellow  Findings: Round 4cm solid mass in the midline pelvis, inferior to the level of the sacral promontory, located within and adjacent to the sigmoid mesentery, with additional 3cm cystic and solid portion that extends cephalad retroperitoneally overlying sacral promontory. Appears consistent with parasitic fibroid. Appendix adherent to the mass and required removal in order to remove mass in its entirety. Normal appearing bilateral ovaries. Few additional subcentimeter lesions on lower abdominal and pelvic wall peritoneum, some solid and possibly representing additional fibroid tissue, some more cystic and may represent endosalpingiosis. Biopsies taken.    Specimens:  ID Type Source Tests Collected by Time Destination  1 : Gallbladder Tissue PATH Gallbladder SURGICAL PATHOLOGY Kinsinger, Arta Bruce, MD 08/21/2022 [phone]   2 : Pelvic Mass and Appendix Tissue PATH Soft tissue resection SURGICAL PATHOLOGY Bernadene Bell, MD 08/21/2022 1006   3 : Peritoneal Biopsy Anterior Tissue PATH Soft tissue SURGICAL PATHOLOGY Bernadene Bell, MD 08/21/2022 1040   4 : Peritoneal Biopsy Left Pelvis Tissue PATH Soft tissue SURGICAL PATHOLOGY Bernadene Bell, MD [id number]      Complications:  None  Indications for Procedure: Erin Ray is a 42 y.o. woman with history of a prior hysterectomy for fibroids, completed with morcellation, who has pelvic pain and imaging concerning for a pelvic mass that may represent a parasitic fibroid.  Prior to the procedure, all risks, benefits, and alternatives were discussed and informed surgical consent was signed.  Procedure: Patient was taken to the operating room where general anesthesia was achieved.  She was positioned in dorsal lithotomy and prepped and draped.  A foley catheter was inserted into the bladder. A spongestick was placed in the vagina  The initial portion of the surgery was performed by Dr. Kieth Brightly. Please see separate operative note for more details.  On re-entry to room, 3 trocars were in place (one 59m robotic trocar to the right of the umbilicus, one 551mtrocar right lateral abdominal wall and one 62m80mrocar in the left upper quadrant). The umbilical incision was open and approximately 2.5cm. A 52m77mlloon trocar was placed through this incision and secured.  A robotic trocar was placed through this 52mm48mcar. The right lateral 62mm t44mar was then replaced with an 8mm ro9mic trocar. And an 8mm rob33mc trocar was placed in the left lateral abdomen. All trocars were placed under direct visualization. The patient placed in steep Trendelenburg. The bowels were moved into the upper abdomen.  The DaVinci robotic surgical system was brought to the patient's bedside and docked.  An incision was made in the peritoneum overlying the right common iliac artery and the retroperitoneum was entered. The appendix was noted to be adherent to the pelvic mass, unable to be safely dissected free. The appendix was grasped and cautery was used to skeletonize the appendiceal artery. This was then cauterized and transected. The remaining mesoappendix was transected with cautery.   At this time, the 8mm97m  robotic port to the  right of the umbilicus was replaced with a 73m robotic trocar. The robotic stapler was introduced. The base of the appendix was clamped, stapled and transected with a 679mload of the robotic stapler.  This area was irrigated and hemostatic.  The retroperitoneal space overlying the right common iliac artery was then developed further bluntly. The right ureter was identified and mobilized. The mass was then serially and meticulous dissected from the retroperitoneal attachments in the presacral area and right pelvic sidewall, taking care to visualize the ureter and right infundibulopelvic ligament during the entirety of the dissection. Next, the mass was then meticulously dissected from the sigmoid mesentery using a combination cautery and blunt dissection, making sure not to compromise the mesentery vasculature. The mass was completely removed, with appendix attached, and placed in an Endocatch bag.   The pelvis was irrigated and hemostasis was achieved. Floseal was placed over the dissection bed. All instruments were removed and the robot was taken from the patient's bedside. The abdomen was desufflated and all ports were removed. The endocatch bag was brought up through the supraumbilical incision. This skin and fascia was extended 29m66mThe mass was then grasped with a Lahey and was partially morcellated in the bag with a scalpel until the mass was removed. The bag was inspected and intact.  The fascia at the 12 mm incision of the right port was closed with 0 Vicryl in a figure of eight fashion. The supraumbilical incision remained open for Dr. KinKieth Brightly return at the end for completion of hernia repair.  The skin at all other incisions was closed with 4-0 Vicryl to reapproximate the subcutaneous tissue and 4-0 monocryl in a subcuticular fashion followed by surgical glue. At this time, Dr. KinKieth Brightlyturned to complete the hernia repair.   Patient tolerated the procedure well. Sponge, lap, and  instrument counts were correct.  Perioperative antibiotic prophylaxis per Dr. KinAmie Portlandocedure.  She was extubated and taken to the PACU in stable condition.  MerBernadene BellD Gynecologic Oncology

## 2022-08-21 NOTE — Progress Notes (Signed)
Patient here for a pre-operative appointment prior to her scheduled surgery on August 21, 2022. She is scheduled for robotic assisted laparoscopic pelvic mass excision with plans for Dr. Kieth Brightly to remove the gallbladder and perform a hernia repair. She has her pre-admission testing appointment later today at [facility].  The surgery was discussed in detail.  See after visit summary for additional details.    Discussed post-op pain management in detail including the aspects of the enhanced recovery pathway. We discussed the use of tylenol post-op and to monitor for a maximum of 4,000 mg in a 24 hour period.    Discussed the use of SCDs and measures to take at home to prevent DVT including frequent mobility.  Reportable signs and symptoms of DVT discussed. Post-operative instructions discussed and expectations for after surgery. Incisional care discussed as well including reportable signs and symptoms including erythema, drainage, wound separation.     5 minutes spent with the patient.  Verbalizing understanding of material discussed. No needs or concerns voiced at the end of the visit. Advised patient to call for any needs.    This appointment is included in the global surgical bundle as pre-operative teaching and has no charge.

## 2022-08-21 NOTE — H&P (Signed)
Brief Pre-operative History & Physical  Patient name: Erin Ray CSN: [id number]: [id number] Admit Date: 08/21/2022 Date of Surgery: 08/21/2022 Performing Service: Gynecology   Code Status: Full Code    Erin Ray is a 42 y.o. female with [place], who presents for: Procedure(s) (LRB): XI ROBOTIC ASSISTED DIAGNOSTIC LAPAROSCOPY WITH PELVIC MASS EXCISION (N/A) LAPAROSCOPIC CHOLECYSTECTOMY (N/A) HERNIA REPAIR EPIGASTRIC ADULT (N/A).   Consent obtained in office is accurate. Risks, benefits, and alternatives to surgery were reviewed, and all questions were answered.  Proceed to the OR as planned.     History of Present Illness:  Erin Ray is a 42 y.o. female with PELVIC MASS, GALLSTONES. She was recently seen in clinic, where a detailed HPI can be found. She was noted to benefit from: Procedure(s) (LRB): XI ROBOTIC ASSISTED DIAGNOSTIC LAPAROSCOPY WITH PELVIC MASS EXCISION (N/A) LAPAROSCOPIC CHOLECYSTECTOMY (N/A) HERNIA REPAIR EPIGASTRIC ADULT (N/A).    Allergies Amoxicillin, Cefuroxime axetil, Allopurinol [acetaminophen], Latex, and Tape  Medications   Current Facility-Administered Medications  Medication Dose Route Frequency Provider Last Rate Last Admin   Chlorhexidine Gluconate Cloth 2 % PADS 6 each  6 each Topical Once Kinsinger, Arta Bruce, MD       And   Chlorhexidine Gluconate Cloth 2 % PADS 6 each  6 each Topical Once Kinsinger, Arta Bruce, MD       ciprofloxacin (CIPRO) IVPB 400 mg  400 mg Intravenous On Call to Sylvester, Arta Bruce, MD       dexamethasone (DECADRON) injection 4 mg  4 mg Intravenous On Call to OR Cross, Melissa D, NP       ketorolac (TORADOL) 15 MG/ML injection 15 mg  15 mg Intravenous On Call to [place], Lenna Sciara D, NP       lactated ringers infusion   Intravenous Continuous Brennan Bailey, MD 75 mL/hr at 08/21/22 [id number] Continued from Pre-op at 08/21/22 0641   scopolamine (TRANSDERM-SCOP) 1  MG/3DAYS 1.5 mg  1 patch Transdermal On Call to [place], Melissa D, NP       scopolamine (TRANSDERM-SCOP) 1 MG/3DAYS 1.5 mg  1 patch Transdermal Once Brennan Bailey, MD   1.5 mg at 08/21/22 [id number]    Vital Signs BP (!) 130/90   Pulse 70   Temp 98.3 F (36.8 C) (Oral)   Resp 16   Wt 136 lb (61.7 kg)   LMP 08/13/2012   SpO2 100%   BMI 24.28 kg/m  Facility age limit for growth %iles is 20 years. Facility age limit for growth %iles is 20 years..   Physical Exam General: Well developed, appears stated age, in no acute distress  Mental status: Alert and oriented x3 Cardiovascular: Normal Pulmonary: Symmetric chest rise, unlabored breathing Relevant System for Surgery: Surgical site examination deferred to the OR   Labs and Studies: Lab Results  Component Value Date   WBC 6.5 08/13/2022   HGB 12.9 08/13/2022   HCT 38.7 08/13/2022   PLT 390 08/13/2022    No results found for: "INR", "APTT" \

## 2022-08-21 NOTE — Discharge Instructions (Addendum)
AFTER SURGERY INSTRUCTIONS   Return to work: 4-6 weeks if applicable   Activity: 1. Be up and out of the bed during the day.  Take a nap if needed.  You may walk up steps but be careful and use the hand rail.  Stair climbing will tire you more than you think, you may need to stop part way and rest.    2. No lifting or straining for 6 weeks over 10 pounds. No pushing, pulling, straining for 6 weeks.   3. No driving for around 1 week(s).  Do not drive if you are taking narcotic pain medicine and make sure that your reaction time has returned.    4. You can shower as soon as the next day after surgery. Shower daily.  Use your regular soap and water (not directly on the incision) and pat your incision(s) dry afterwards; don't rub.  No tub baths or submerging your body in water until cleared by your surgeon. If you have the soap that was given to you by pre-surgical testing that was used before surgery, you do not need to use it afterwards because this can irritate your incisions.    5. No sexual activity and nothing in the vagina for 4 weeks.   6. You may experience a small amount of clear drainage from your incisions, which is normal.  If the drainage persists, increases, or changes color please call the office.   7. Do not use creams, lotions, or ointments such as neosporin on your incisions after surgery until advised by your surgeon because they can cause removal of the dermabond glue on your incisions.     8. Take Tylenol or ibuprofen first for pain if you are able to take these medications and only use narcotic pain medication for severe pain not relieved by the Tylenol or Ibuprofen.  Monitor your Tylenol intake to a max of 4,000 mg in a 24 hour period. You can alternate these medications after surgery.   Diet: 1. Low sodium Heart Healthy Diet is recommended but you are cleared to resume your normal (before surgery) diet after your procedure.   2. It is safe to use a laxative, such as  Miralax or Colace, if you have difficulty moving your bowels.    Wound Care: 1. Keep clean and dry.  Shower daily.   Reasons to call the Doctor: Fever - Oral temperature greater than 100.4 degrees Fahrenheit Foul-smelling vaginal discharge Difficulty urinating Nausea and vomiting Increased pain at the site of the incision that is unrelieved with pain medicine. Difficulty breathing with or without chest pain New calf pain especially if only on one side Sudden, continuing increased vaginal bleeding with or without clots.   Contacts: For questions or concerns you should contact:   Dr. Bernadene Bell at [place], NP at [phone]   After Hours: call [phone] and have the GYN Oncologist paged/contacted (after 5 pm or on the weekends).   Messages sent via mychart are for non-urgent matters and are not responded to after hours so for urgent needs, please call the after hours number.    ___________________________________________ [facility] ______[facility] LAPAROSCOPIC SURGERY: POST OP INSTRUCTIONS Always review your discharge instruction sheet given to you by the facility where your surgery was performed. IF YOU HAVE DISABILITY OR FAMILY LEAVE FORMS, YOU MUST BRING THEM TO THE OFFICE FOR PROCESSING.   DO NOT GIVE THEM TO YOUR DOCTOR.  A prescription for pain medication may be given to  you upon discharge.  Take your pain medication as prescribed, if needed.  If narcotic pain medicine is not needed, then you may take acetaminophen (Tylenol) or ibuprofen (Advil) as needed. Take your usually prescribed medications unless otherwise directed. If you need a refill on your pain medication, please contact your pharmacy.  They will contact our office to request authorization. Prescriptions will not be filled after 5pm or on week-ends. You should follow a light diet the first few days after arrival home, such as soup and crackers, etc.  Be sure to include  lots of fluids daily. Most patients will experience some swelling and bruising in the area of the incisions.  Ice packs will help.  Swelling and bruising can take several days to resolve.  It is common to experience some constipation if taking pain medication after surgery.  Increasing fluid intake and taking a stool softener (such as Colace) will usually help or prevent this problem from occurring.  A mild laxative (Milk of Magnesia or Miralax) should be taken according to package instructions if there are no bowel movements after 48 hours. Unless discharge instructions indicate otherwise, you may remove your bandages 24-48 hours after surgery, and you may shower at that time.  You may have steri-strips (small skin tapes) in place directly over the incision.  These strips should be left on the skin for 7-10 days.  If your surgeon used skin glue on the incision, you may shower in 24 hours.  The glue will flake off over the next 2-3 weeks.  Any sutures or staples will be removed at the office during your follow-up visit. ACTIVITIES:  You may resume regular (light) daily activities beginning the next day--such as daily self-care, walking, climbing stairs--gradually increasing activities as tolerated.  You may have sexual intercourse when it is comfortable.  Refrain from any heavy lifting or straining until approved by your doctor. You may drive when you are no longer taking prescription pain medication, you can comfortably wear a seatbelt, and you can safely maneuver your car and apply brakes. RETURN TO WORK:  __________________________________________________________ Dennis Bast should see your doctor in the office for a follow-up appointment approximately 2-3 weeks after your surgery.  Make sure that you call for this appointment within a day or two after you arrive home to insure a convenient appointment time. OTHER INSTRUCTIONS:  __________________________________________________________________________________________________________________________ __________________________________________________________________________________________________________________________ WHEN TO CALL YOUR DOCTOR: Fever over 101.0 Inability to urinate Continued bleeding from incision. Increased pain, redness, or drainage from the incision. Increasing abdominal pain  The clinic staff is available to answer your questions during regular business hours.  Please don't hesitate to call and ask to speak to one of the nurses for clinical concerns.  If you have a medical emergency, go to the nearest emergency room or call 911.  A surgeon from [facility] is always on call at the hospital. [address] [place], [place], [place]  [address] ? [address], [place], [place]   [address] [phone] ? [phone] ? FAX [phone] Web site: [email or web address]

## 2022-08-22 ENCOUNTER — Telehealth: Payer: Self-pay

## 2022-08-22 ENCOUNTER — Encounter (HOSPITAL_COMMUNITY): Payer: Self-pay | Admitting: Psychiatry

## 2022-08-22 LAB — SURGICAL PATHOLOGY

## 2022-08-22 NOTE — Telephone Encounter (Signed)
Spoke with Erin Ray this morning. She states she is eating, drinking and urinating well. She has not had a BM yet but is passing gas. She is taking senokot as prescribed and encouraged her to drink plenty of water. She denies fever or chills. Incisions are dry and intact. She rates her pain 5/10. Her pain is controlled with Tylenol, she has not had to use the Oxycodone.     Instructed to call office with any fever, chills, purulent drainage, uncontrolled pain or any other questions or concerns. Patient verbalizes understanding.   Pt aware of post op appointments as well as the office number [phone] and after hours number [phone] to call if she has any questions or concerns

## 2022-09-03 ENCOUNTER — Encounter: Payer: Self-pay | Admitting: Psychiatry

## 2022-09-03 ENCOUNTER — Inpatient Hospital Stay: Payer: Medicare Other | Attending: Psychiatry | Admitting: Psychiatry

## 2022-09-03 VITALS — BP 126/84 | HR 75 | Temp 98.5°F | Resp 14 | Wt 139.0 lb

## 2022-09-03 DIAGNOSIS — Z9889 Other specified postprocedural states: Secondary | ICD-10-CM | POA: Diagnosis not present

## 2022-09-03 DIAGNOSIS — D484 Neoplasm of uncertain behavior of peritoneum: Secondary | ICD-10-CM | POA: Diagnosis not present

## 2022-09-03 DIAGNOSIS — Z7189 Other specified counseling: Secondary | ICD-10-CM

## 2022-09-03 DIAGNOSIS — D219 Benign neoplasm of connective and other soft tissue, unspecified: Secondary | ICD-10-CM

## 2022-09-03 DIAGNOSIS — R1900 Intra-abdominal and pelvic swelling, mass and lump, unspecified site: Secondary | ICD-10-CM

## 2022-09-03 NOTE — Progress Notes (Signed)
Gynecologic Oncology Return Clinic Visit  Date of Service: 09/03/2022 Referring Provider: Irene Pap, MD   Assessment & Plan: Erin Ray is a 42 y.o. woman with a parasitic fibroid, cholecystitis and umbilical hernia who is 2 weeks s/p Robotic-assisted laparoscopic excision of parasitic fibroid, appendectomy; Laparoscopic cholecystectomy, incisional hernia repair (Dr. Kieth Brightly) on 08/21/22.  Postop: - Pt recovering well from surgery and healing appropriately postoperatively - Intraoperative findings and pathology results reviewed. - Ongoing postoperative expectations and precautions reviewed. Can start to reintroduce activities. - Has follow-up with Dr. Kieth Brightly 09/12/21.  Peritoneal leiomyomatosis: - Scattered additional subcentimeter nodules noted in peritoneal cavity. - Biopsy of one confirmed diagnosis of leiomyoma, c/w DPL. - Patient aware of results.  - Okay to return to routine Ob/Gyn care. - May be important to be aware of this finding if new symptoms were to arise in the future or if postmenopausal HRT is considered in the future.  RTC PRN.  Bernadene Bell, MD Gynecologic Oncology   Medical Decision Making I personally spent  TOTAL 20 minutes face-to-face and non-face-to-face in the care of this patient, which includes all pre, intra, and post visit time on the date of service.  ----------------------- Reason for Visit: Postop  Interval History: Pt reports that she is recovering well from surgery. She is using tylenol PRN for pain. She is eating and drinking well. She is voiding without issue and having regular bowel movements.    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 [place] fever meningitis 07/02/2009    Past Surgical History:  Procedure Laterality Date   BILATERAL SALPINGECTOMY Bilateral 10/09/2012   Procedure: BILATERAL SALPINGECTOMY;  Surgeon: Emily Filbert, MD;  Location: [facility];  Service: Gynecology;  Laterality: Bilateral;   BRAIN SURGERY  2011   hist of [place] , "brain swelling"  drained fluid   BRAIN SURGERY     past [place], will be followed here by [facility]   CHOLECYSTECTOMY N/A 08/21/2022   Procedure: LAPAROSCOPIC CHOLECYSTECTOMY;  Surgeon: Mickeal Skinner, MD;  Location: [facility];  Service: General;  Laterality: N/A;   CYSTOSCOPY N/A 10/09/2012   Procedure: Consuela Mimes;  Surgeon: Emily Filbert, MD;  Location: [facility];  Service: Gynecology;  Laterality: N/A;   DILATION AND CURETTAGE OF UTERUS     EPIGASTRIC HERNIA REPAIR N/A 08/21/2022   Procedure: HERNIA REPAIR EPIGASTRIC ADULT;  Surgeon: Kinsinger, Arta Bruce, MD;  Location: [facility];  Service: General;  Laterality: N/A;   ESOPHAGEAL DILATION  2011   MULTIPLE TOOTH EXTRACTIONS     ROBOTIC ASSISTED TOTAL HYSTERECTOMY N/A 10/09/2012   Procedure: ROBOTIC ASSISTED TOTAL HYSTERECTOMY;  Surgeon: Emily Filbert, MD;  Location: [facility];  Service: Gynecology;  Laterality: N/A;    Family History  Problem Relation Age of Onset   Hypertension Mother    Breast cancer Paternal Aunt        x2; first diagnosis under 42   Prostate cancer  Paternal Uncle 37   Lung cancer Maternal Grandmother    Breast cancer Paternal Grandmother    Colon cancer Paternal Grandmother    Ovarian cancer Paternal Grandmother    Lung cancer Paternal Grandfather    Endometrial cancer Neg Hx    Pancreatic cancer Neg Hx     Social History   Socioeconomic History   Marital status: Single    Spouse name: Not on file   Number of children: 0   Years of education: Not on file    Highest education level: Not on file  Occupational History   Not on file  Tobacco Use   Smoking status: Never   Smokeless tobacco: Never  Vaping Use   Vaping Use: Never used  Substance and Sexual Activity   Alcohol use: Not Currently    Comment: stopped wine in 2011   Drug use: Not Currently    Comment: stopped weed in 2006   Sexual activity: Not Currently    Birth control/protection: Surgical  Other Topics Concern   Not on file  Social History Narrative   Not on file   Social Determinants of Health   Financial Resource Strain: Not on file  Food Insecurity: Not on file  Transportation Needs: Not on file  Physical Activity: Not on file  Stress: Not on file  Social Connections: Not on file    Current Medications:  Current Outpatient Medications:    acetaminophen (TYLENOL) 500 MG tablet, Take 500-1,000 mg by mouth every 6 (six) hours as needed (for pain or headaches)., Disp: , Rfl:    calcium carbonate (TUMS - DOSED IN MG ELEMENTAL CALCIUM) 500 MG chewable tablet, Chew 1 tablet by mouth 2 (two) times daily as needed for heartburn. , Disp: , Rfl:    diphenhydrAMINE (BENADRYL) 12.5 MG/5ML liquid, Take 25 mg by mouth 4 (four) times daily as needed for allergies (or cold symptoms). , Disp: , Rfl:    fluticasone (FLONASE) 50 MCG/ACT nasal spray, Place 1 spray into both nostrils 2 (two) times daily as needed for allergies. , Disp: , Rfl:    FOLIC ACID PO, Take 1 tablet by mouth daily., Disp: , Rfl:    guaifenesin (ROBITUSSIN) 100 MG/5ML syrup, Take 200 mg by mouth 3 (three) times daily as needed for cough., Disp: , Rfl:    Iron-Vitamin C (IRON 100/C) 100-250 MG TABS, , Disp: , Rfl:    Multiple Vitamins-Minerals (HAIR SKIN & NAILS PO), Take 1 tablet by mouth daily., Disp: , Rfl:    naphazoline-pheniramine (VISINE) 0.025-0.3 % ophthalmic solution, Place 1 drop into both eyes 4 (four) times daily as needed for eye irritation., Disp: , Rfl:    pseudoephedrine (SUDAFED) 120 MG 12 hr  tablet, Take 120 mg by mouth 2 (two) times daily as needed for congestion. , Disp: , Rfl:    SUPER B COMPLEX/C PO, , Disp: , Rfl:   Review of Symptoms: Complete 10-system review is positive for: cough  Physical Exam: BP 126/84 (BP Location: Left Arm, Patient Position: Sitting)   Pulse 75   Temp 98.5 F (36.9 C) (Oral)   Resp 14   Wt 139 lb (63 kg)   LMP 08/13/2012   SpO2 98%   BMI 24.82 kg/m  General: Alert, oriented, no acute distress. HEENT: Normocephalic, atraumatic. Neck symmetric without masses. Sclera anicteric.  Chest: Normal work of breathing. Clear to auscultation bilaterally.   Cardiovascular: Regular rate and rhythm, no murmurs. Abdomen: Soft, nontender.  Normoactive bowel sounds.  No masses or  hepatosplenomegaly appreciated.  Well-healing incisions. Extremities: Grossly normal range of motion.  Warm, well perfused.  No edema bilaterally. Skin: No rashes or lesions noted.  Laboratory & Radiologic Studies: FINAL MICROSCOPIC DIAGNOSIS:   A. GALLBLADDER, CHOLECYSTECTOMY:  Cholecystitis with mucosal necrosis  Cholelithiasis   B. APPENDIX AND PELVIC MASS, EXCISION:  Pelvic mass      Leiomyoma  Appendix     Endometriosis  Negative for malignancy   C. PERITONEAL, ANTERIOR, BIOPSY:  Leiomyoma  Negative for malignancy   D. PERITONEAL, LEFT PELVIS, BIOPSY:  Benign peritoneum with cautery artifact  Negative for endometriosis or malignancy

## 2022-09-03 NOTE — Patient Instructions (Signed)
It was a pleasure to see you in clinic today. - Healing well. - Okay to return to routine Ob/Gyn care. But let us know if we can help with anything in the future.  Thank you very much for allowing me to provide care for you today.  I appreciate your confidence in choosing our Gynecologic Oncology team at [facility].  If you have any questions about your visit today please call our office or send [place] a MyChart message and we will get back to you as soon as possible.

## 2023-01-14 ENCOUNTER — Other Ambulatory Visit: Payer: Self-pay | Admitting: Family

## 2023-01-14 DIAGNOSIS — Z1231 Encounter for screening mammogram for malignant neoplasm of breast: Secondary | ICD-10-CM

## 2023-02-06 ENCOUNTER — Ambulatory Visit
Admission: RE | Admit: 2023-02-06 | Discharge: 2023-02-06 | Disposition: A | Discharge status: Home / Self Care | Payer: Medicare Other | Source: Ambulatory Visit | Attending: Family | Admitting: Family

## 2023-02-06 DIAGNOSIS — Z1231 Encounter for screening mammogram for malignant neoplasm of breast: Secondary | ICD-10-CM

## 2023-06-14 ENCOUNTER — Other Ambulatory Visit: Payer: Self-pay

## 2023-06-14 ENCOUNTER — Encounter (HOSPITAL_COMMUNITY): Payer: Self-pay

## 2023-06-14 ENCOUNTER — Emergency Department (HOSPITAL_COMMUNITY)
Admission: EM | Admit: 2023-06-14 | Discharge: 2023-06-14 | Disposition: A | Discharge status: Against Medical Advice | Payer: Medicare HMO | Attending: Emergency Medicine | Admitting: Emergency Medicine

## 2023-06-14 ENCOUNTER — Emergency Department (HOSPITAL_COMMUNITY): Payer: Medicare HMO

## 2023-06-14 DIAGNOSIS — R0602 Shortness of breath: Secondary | ICD-10-CM | POA: Diagnosis not present

## 2023-06-14 DIAGNOSIS — R0989 Other specified symptoms and signs involving the circulatory and respiratory systems: Secondary | ICD-10-CM | POA: Diagnosis not present

## 2023-06-14 DIAGNOSIS — R059 Cough, unspecified: Secondary | ICD-10-CM | POA: Diagnosis not present

## 2023-06-14 DIAGNOSIS — Z5321 Procedure and treatment not carried out due to patient leaving prior to being seen by health care provider: Secondary | ICD-10-CM | POA: Insufficient documentation

## 2023-06-14 DIAGNOSIS — R1310 Dysphagia, unspecified: Secondary | ICD-10-CM | POA: Diagnosis not present

## 2023-06-14 NOTE — ED Triage Notes (Signed)
C/o coughing and chest congestion after using lysol. Patient also c/o sob.

## 2023-06-14 NOTE — ED Provider Triage Note (Signed)
Emergency Medicine Provider Triage Evaluation Note  Erin Ray , a 42 y.o. female  was evaluated in triage.  Pt complains of coughing and chest congestion after using strong cleaners earlier today.  She reports it made her feel short of breath and scared her.   Review of Systems  Positive: As above Negative: As above  Physical Exam  BP (!) 149/97 (BP Location: Left Arm)   Pulse 74   Temp 97.7 F (36.5 C) (Oral)   Resp 16   LMP 08/13/2012   SpO2 100%  Gen:   Awake, no distress   Resp:  Normal effort  MSK:   Moves extremities without difficulty  Other:    Medical Decision Making  Medically screening exam initiated at 8:13 PM.  Appropriate orders placed.  [name] was informed that the remainder of the evaluation will be completed by another provider, this initial triage assessment does not replace that evaluation, and the importance of remaining in the ED until their evaluation is complete.     Melton Alar R, PA-C 06/14/23 2015

## 2023-08-06 DIAGNOSIS — J329 Chronic sinusitis, unspecified: Secondary | ICD-10-CM | POA: Diagnosis not present

## 2023-08-06 DIAGNOSIS — K219 Gastro-esophageal reflux disease without esophagitis: Secondary | ICD-10-CM | POA: Diagnosis not present

## 2023-08-06 DIAGNOSIS — R252 Cramp and spasm: Secondary | ICD-10-CM | POA: Diagnosis not present

## 2023-08-06 DIAGNOSIS — R202 Paresthesia of skin: Secondary | ICD-10-CM | POA: Diagnosis not present

## 2023-08-06 DIAGNOSIS — R200 Anesthesia of skin: Secondary | ICD-10-CM | POA: Diagnosis not present

## 2023-12-20 ENCOUNTER — Encounter (HOSPITAL_COMMUNITY): Payer: Self-pay

## 2023-12-20 ENCOUNTER — Other Ambulatory Visit: Payer: Self-pay

## 2023-12-20 ENCOUNTER — Emergency Department (HOSPITAL_COMMUNITY)
Admission: EM | Admit: 2023-12-20 | Discharge: 2023-12-20 | Disposition: A | Discharge status: Home / Self Care | Attending: Emergency Medicine | Admitting: Emergency Medicine

## 2023-12-20 DIAGNOSIS — Z91040 Latex allergy status: Secondary | ICD-10-CM | POA: Insufficient documentation

## 2023-12-20 DIAGNOSIS — R21 Rash and other nonspecific skin eruption: Secondary | ICD-10-CM | POA: Diagnosis not present

## 2023-12-20 DIAGNOSIS — L509 Urticaria, unspecified: Secondary | ICD-10-CM | POA: Diagnosis not present

## 2023-12-20 MED ORDER — PREDNISONE 20 MG PO TABS: ORAL_TABLET | ORAL | 0 refills | Status: AC

## 2023-12-20 MED ORDER — EPINEPHRINE 0.3 MG/0.3ML IJ SOAJ: 0.3000 mg | INTRAMUSCULAR | 0 refills | Status: AC | PRN

## 2023-12-20 NOTE — ED Triage Notes (Signed)
 Pt arrives via POV. Pt reports she has had an itchy rash on her extremities and torso for about 1 week. Pt is AxOx4.

## 2023-12-20 NOTE — Discharge Instructions (Signed)
 Your rash is likely an allergic reaction.  You may take prednisone as prescribed.  Continue to take Benadryl  as needed for itchiness.  Follow-up with an allergist for further care.  Have an EpiPen  available in the event that you develop any kind of anaphylactic reaction to use it promptly and return to the ER for further care.

## 2023-12-20 NOTE — ED Provider Notes (Signed)
  EMERGENCY DEPARTMENT AT [facility] Provider Note   CSN: [id number] Arrival date & time: 12/20/23  4098     Patient presents with: Rash   Erin Ray is a 43 y.o. female.   The history is provided by the patient and medical records. No language interpreter was used.  Rash Associated symptoms: no fever      43 year old female presenting for evaluation of a rash.  Patient reports for the past week she has noticed itchy rash that manifest throughout her body that comes and goes.  Rash is more noticeable around her neck at this time.  She tries taking Benadryl  at home without adequate relief.  She did not complain of any tongue swelling, trouble swallowing, trouble breathing, wheezing, abdominal cramping, shortness of breath lightheadedness or dizziness.  No one else at home with similar symptoms.  She denies any environmental changes no new medication no new sickness nothing else that she can attribute to her symptoms.  Patient has had similar allergic rash to different things in the past.  Prior to Admission medications   Medication Sig Start Date End Date Taking? Authorizing Provider  acetaminophen  (TYLENOL ) 500 MG tablet Take 500-1,000 mg by mouth every 6 (six) hours as needed (for pain or headaches).    [provider]  calcium carbonate (TUMS - DOSED IN MG ELEMENTAL CALCIUM) 500 MG chewable tablet Chew 1 tablet by mouth 2 (two) times daily as needed for heartburn.     [provider]  diphenhydrAMINE  (BENADRYL ) 12.5 MG/5ML liquid Take 25 mg by mouth 4 (four) times daily as needed for allergies (or cold symptoms).     [provider]  fluticasone (FLONASE) 50 MCG/ACT nasal spray Place 1 spray into both nostrils 2 (two) times daily as needed for allergies.     [provider]  FOLIC ACID PO Take 1 tablet by mouth daily.    [provider]  guaifenesin (ROBITUSSIN) 100 MG/5ML syrup Take 200 mg by mouth 3 (three) times  daily as needed for cough.    [provider]  Iron-Vitamin C (IRON 100/C) 100-250 MG TABS  08/09/22   [provider]  Multiple Vitamins-Minerals (HAIR SKIN & NAILS PO) Take 1 tablet by mouth daily.    [provider]  naphazoline-pheniramine (VISINE) 0.025-0.3 % ophthalmic solution Place 1 drop into both eyes 4 (four) times daily as needed for eye irritation.    [provider]  pseudoephedrine (SUDAFED) 120 MG 12 hr tablet Take 120 mg by mouth 2 (two) times daily as needed for congestion.     [provider]  SUPER B COMPLEX/C PO  08/09/22   [provider]    Allergies: Amoxicillin, Cefuroxime axetil, Penicillins, Allopurinol [acetaminophen ], Dicyclomine , Latex, Silicone, and Tape    Review of Systems  Constitutional:  Negative for fever.  Skin:  Positive for rash.    Updated Vital Signs BP 123/88 (BP Location: Right Arm)   Pulse 87   Temp 98.4 F (36.9 C) (Oral)   Resp 18   LMP 08/13/2012   SpO2 99%   Physical Exam Vitals and nursing note reviewed.  Constitutional:      General: She is not in acute distress.    Appearance: She is well-developed.     Comments: Patient is well-appearing in no acute discomfort.  HENT:     Head: Atraumatic.     Mouth/Throat:     Comments: Normal oral mucosa, normal tongue, no edema  Eyes:  Conjunctiva/sclera: Conjunctivae normal.    Cardiovascular:     Rate and Rhythm: Normal rate and regular rhythm.     Pulses: Normal pulses.     Heart sounds: Normal heart sounds.  Pulmonary:     Effort: Pulmonary effort is normal.     Breath sounds: No wheezing, rhonchi or rales.  Abdominal:     Palpations: Abdomen is soft.     Tenderness: There is no abdominal tenderness.   Musculoskeletal:     Cervical back: Neck supple.   Skin:    Findings: No rash (Sporadic urticarial rash noted to body including neck and forearms.  It appears more in the sun-exposed area.).   Neurological:     Mental  Status: She is alert. Mental status is at baseline.   Psychiatric:        Mood and Affect: Mood normal.     (all labs ordered are listed, but only abnormal results are displayed) Labs Reviewed - No data to display  EKG: None  Radiology: No results found.   Procedures   Medications Ordered in the ED - No data to display                                  Medical Decision Making  BP 123/88 (BP Location: Right Arm)   Pulse 87   Temp 98.4 F (36.9 C) (Oral)   Resp 18   Ht 5' 2 (1.575 m)   Wt 64.4 kg   LMP 08/13/2012   SpO2 99%   BMI 25.97 kg/m   63:90 AM 43 year old female presenting for evaluation of a rash.  Patient reports for the past week she has noticed itchy rash that manifest throughout her body that comes and goes.  Rash is more noticeable around her neck at this time.  She tries taking Benadryl  at home without adequate relief.  She did not complain of any tongue swelling, trouble swallowing, trouble breathing, wheezing, abdominal cramping, shortness of breath lightheadedness or dizziness.  No one else at home with similar symptoms.  She denies any environmental changes no new medication no new sickness nothing else that she can attribute to her symptoms.  Patient has had similar allergic rash to different things in the past.  On exam this is a well-appearing female resting comfortably appears to be in no acute discomfort.  She does have some urticaria rash noted most significant to her neck area and forearms but not presents in sun covered area.  Patient without any anaphylactic symptoms.  At this time will prescribe short course of prednisone.  Patient to continues with Benadryl  at home.  Recommend outpatient follow-up with allergist for further care.  I will also prescribe an epinephrine  pen to have available.     Final diagnoses:  Urticarial rash    ED Discharge Orders          Ordered    predniSONE (DELTASONE) 20 MG tablet        12/20/23 0840     EPINEPHrine  0.3 mg/0.3 mL IJ SOAJ injection  As needed        12/20/23 0840               Debbra Fairy, PA-C 12/20/23 1610    Burnette Carte, MD 12/20/23 1212

## 2024-01-07 ENCOUNTER — Other Ambulatory Visit: Payer: Self-pay | Admitting: Family

## 2024-01-07 DIAGNOSIS — Z1231 Encounter for screening mammogram for malignant neoplasm of breast: Secondary | ICD-10-CM

## 2024-02-10 ENCOUNTER — Ambulatory Visit
Admission: RE | Admit: 2024-02-10 | Discharge: 2024-02-10 | Disposition: A | Discharge status: Home / Self Care | Source: Ambulatory Visit | Attending: Family

## 2024-02-10 DIAGNOSIS — Z1231 Encounter for screening mammogram for malignant neoplasm of breast: Secondary | ICD-10-CM

## 2024-02-10 HISTORY — DX: Encounter for nonprocreative screening for genetic disease carrier status: Z13.71
